# Patient Record
Sex: Male | Born: 1980 | Race: Black or African American | Hispanic: No | Marital: Single | Smoking: Former smoker
Health system: Southern US, Community
[De-identification: ages and names within clinical notes are randomized; demographics above are authoritative.]

## PROBLEM LIST (undated history)

## (undated) DIAGNOSIS — G4733 Obstructive sleep apnea (adult) (pediatric): Secondary | ICD-10-CM

## (undated) DIAGNOSIS — I219 Acute myocardial infarction, unspecified: Secondary | ICD-10-CM

## (undated) DIAGNOSIS — E7800 Pure hypercholesterolemia, unspecified: Secondary | ICD-10-CM

## (undated) DIAGNOSIS — B019 Varicella without complication: Secondary | ICD-10-CM

## (undated) DIAGNOSIS — I10 Essential (primary) hypertension: Secondary | ICD-10-CM

## (undated) DIAGNOSIS — E119 Type 2 diabetes mellitus without complications: Secondary | ICD-10-CM

## (undated) HISTORY — DX: Essential (primary) hypertension: I10

## (undated) HISTORY — DX: Obstructive sleep apnea (adult) (pediatric): G47.33

## (undated) HISTORY — DX: Varicella without complication: B01.9

---

## 2013-01-03 ENCOUNTER — Ambulatory Visit: Payer: Self-pay | Admitting: Internal Medicine

## 2013-03-13 ENCOUNTER — Ambulatory Visit (INDEPENDENT_AMBULATORY_CARE_PROVIDER_SITE_OTHER): Payer: Managed Care, Other (non HMO) | Admitting: Family Medicine

## 2013-03-13 ENCOUNTER — Encounter: Payer: Self-pay | Admitting: Family Medicine

## 2013-03-13 VITALS — BP 110/78 | HR 73 | Temp 98.1°F | Ht 70.75 in | Wt 254.2 lb

## 2013-03-13 DIAGNOSIS — F17200 Nicotine dependence, unspecified, uncomplicated: Secondary | ICD-10-CM | POA: Insufficient documentation

## 2013-03-13 DIAGNOSIS — I10 Essential (primary) hypertension: Secondary | ICD-10-CM | POA: Insufficient documentation

## 2013-03-13 LAB — LIPID PANEL
HDL: 34.8 mg/dL — ABNORMAL LOW (ref >39.00)
Total CHOL/HDL Ratio: 6
Triglycerides: 444 mg/dL — ABNORMAL HIGH (ref 0.0–149.0)
VLDL: 88.8 mg/dL — ABNORMAL HIGH (ref 0.0–40.0)

## 2013-03-13 LAB — BASIC METABOLIC PANEL
CO2: 25 mEq/L (ref 19–32)
Chloride: 105 mEq/L (ref 96–112)
Potassium: 3.9 mEq/L (ref 3.5–5.1)
Sodium: 136 mEq/L (ref 135–145)

## 2013-03-13 LAB — TSH: TSH: 1.08 u[IU]/mL (ref 0.35–5.50)

## 2013-03-13 LAB — HEPATIC FUNCTION PANEL
Albumin: 4.5 g/dL (ref 3.5–5.2)
Total Bilirubin: 0.7 mg/dL (ref 0.3–1.2)

## 2013-03-13 MED ORDER — LISINOPRIL 40 MG PO TABS: 40.0000 mg | ORAL_TABLET | Freq: Every day | ORAL | Status: DC

## 2013-03-13 NOTE — Patient Instructions (Addendum)
Schedule your complete physical for Feb We'll notify you of your lab results and make any changes if needed Try and quit smoking by switching to the E cig and stepping down the amount of nicotine Call with any questions or concerns Welcome!  We're glad to have you!

## 2013-03-13 NOTE — Progress Notes (Signed)
  Subjective:    Patient ID: Danny Reid, male    DOB: [birth year], 32 y.o.   MRN: [id number]  HPI New to establish.  Previous MD- [facility], no longer takes pt's insurance.  Last CPE Feb  HTN- chronic problem, on Lisinopril 40mg .  dx'd while in HS.  Strong family hx of HTN- mom, dad, extended family.  No CP, SOB, HAs, visual changes, edema.  Tobacco Use- started smoking age 54.  Smoking ~1/2 ppd.  Thinking about quitting.  Is considering E cigs.   Review of Systems For ROS see HPI     Objective:   Physical Exam  Vitals reviewed. Constitutional: He is oriented to person, place, and time. He appears well-developed and well-nourished. No distress.  HENT:  Head: Normocephalic and atraumatic.  Eyes: Conjunctivae and EOM are normal. Pupils are equal, round, and reactive to light.  Neck: Normal range of motion. Neck supple. No thyromegaly present.  Cardiovascular: Normal rate, regular rhythm, normal heart sounds and intact distal pulses.   No murmur heard. Pulmonary/Chest: Effort normal and breath sounds normal. No respiratory distress.  Abdominal: Soft. Bowel sounds are normal. He exhibits no distension.  Musculoskeletal: He exhibits no edema.  Lymphadenopathy:    He has no cervical adenopathy.  Neurological: He is alert and oriented to person, place, and time. No cranial nerve deficit.  Skin: Skin is warm and dry.  Psychiatric: He has a normal mood and affect. His behavior is normal.          Assessment & Plan:

## 2013-03-13 NOTE — Assessment & Plan Note (Signed)
New to provider, ongoing for pt.  Pt reports he's ready to quit due to cost of smoking.  Reviewed various options- patch, gum, lozenges, wellbutrin, chantix.  Pt prefers to attempt E cig.  Will follow.

## 2013-03-13 NOTE — Assessment & Plan Note (Signed)
New to provider, chronic for pt.  Excellent control.  Asymptomatic.  Check labs.  Encouraged smoking cessation.  Will follow.

## 2015-10-26 DIAGNOSIS — E6609 Other obesity due to excess calories: Secondary | ICD-10-CM | POA: Insufficient documentation

## 2015-10-26 DIAGNOSIS — R7309 Other abnormal glucose: Secondary | ICD-10-CM | POA: Insufficient documentation

## 2015-10-26 DIAGNOSIS — Z719 Counseling, unspecified: Secondary | ICD-10-CM | POA: Insufficient documentation

## 2017-04-23 DIAGNOSIS — R635 Abnormal weight gain: Secondary | ICD-10-CM | POA: Insufficient documentation

## 2017-04-23 DIAGNOSIS — R0683 Snoring: Secondary | ICD-10-CM | POA: Insufficient documentation

## 2017-11-02 DIAGNOSIS — E119 Type 2 diabetes mellitus without complications: Secondary | ICD-10-CM | POA: Insufficient documentation

## 2018-03-05 DIAGNOSIS — I219 Acute myocardial infarction, unspecified: Secondary | ICD-10-CM

## 2018-03-05 HISTORY — DX: Acute myocardial infarction, unspecified: I21.9

## 2018-03-06 ENCOUNTER — Encounter (HOSPITAL_BASED_OUTPATIENT_CLINIC_OR_DEPARTMENT_OTHER): Payer: Self-pay | Admitting: Emergency Medicine

## 2018-03-06 ENCOUNTER — Emergency Department (HOSPITAL_BASED_OUTPATIENT_CLINIC_OR_DEPARTMENT_OTHER): Payer: 59

## 2018-03-06 ENCOUNTER — Other Ambulatory Visit: Payer: Self-pay

## 2018-03-06 ENCOUNTER — Inpatient Hospital Stay (HOSPITAL_COMMUNITY)
Admission: EM | Disposition: A | Discharge status: Home / Self Care | Payer: Self-pay | Source: Home / Self Care | Attending: Cardiovascular Disease

## 2018-03-06 ENCOUNTER — Inpatient Hospital Stay (HOSPITAL_BASED_OUTPATIENT_CLINIC_OR_DEPARTMENT_OTHER)
Admission: EM | Admit: 2018-03-06 | Discharge: 2018-03-08 | DRG: 246 | Disposition: A | Discharge status: Home / Self Care | Payer: 59 | Attending: Cardiovascular Disease | Admitting: Cardiovascular Disease

## 2018-03-06 DIAGNOSIS — I110 Hypertensive heart disease with heart failure: Secondary | ICD-10-CM | POA: Diagnosis present

## 2018-03-06 DIAGNOSIS — I2119 ST elevation (STEMI) myocardial infarction involving other coronary artery of inferior wall: Secondary | ICD-10-CM | POA: Diagnosis present

## 2018-03-06 DIAGNOSIS — I2111 ST elevation (STEMI) myocardial infarction involving right coronary artery: Secondary | ICD-10-CM

## 2018-03-06 DIAGNOSIS — E7800 Pure hypercholesterolemia, unspecified: Secondary | ICD-10-CM | POA: Diagnosis present

## 2018-03-06 DIAGNOSIS — Z7984 Long term (current) use of oral hypoglycemic drugs: Secondary | ICD-10-CM | POA: Diagnosis not present

## 2018-03-06 DIAGNOSIS — I10 Essential (primary) hypertension: Secondary | ICD-10-CM | POA: Diagnosis present

## 2018-03-06 DIAGNOSIS — Z8249 Family history of ischemic heart disease and other diseases of the circulatory system: Secondary | ICD-10-CM | POA: Diagnosis not present

## 2018-03-06 DIAGNOSIS — I214 Non-ST elevation (NSTEMI) myocardial infarction: Secondary | ICD-10-CM

## 2018-03-06 DIAGNOSIS — R748 Abnormal levels of other serum enzymes: Secondary | ICD-10-CM | POA: Diagnosis present

## 2018-03-06 DIAGNOSIS — I252 Old myocardial infarction: Secondary | ICD-10-CM | POA: Insufficient documentation

## 2018-03-06 DIAGNOSIS — Z6836 Body mass index (BMI) 36.0-36.9, adult: Secondary | ICD-10-CM

## 2018-03-06 DIAGNOSIS — F17210 Nicotine dependence, cigarettes, uncomplicated: Secondary | ICD-10-CM | POA: Diagnosis present

## 2018-03-06 DIAGNOSIS — I340 Nonrheumatic mitral (valve) insufficiency: Secondary | ICD-10-CM | POA: Diagnosis not present

## 2018-03-06 DIAGNOSIS — E669 Obesity, unspecified: Secondary | ICD-10-CM | POA: Diagnosis present

## 2018-03-06 DIAGNOSIS — Z820 Family history of epilepsy and other diseases of the nervous system: Secondary | ICD-10-CM

## 2018-03-06 DIAGNOSIS — F17200 Nicotine dependence, unspecified, uncomplicated: Secondary | ICD-10-CM | POA: Diagnosis present

## 2018-03-06 DIAGNOSIS — R7989 Other specified abnormal findings of blood chemistry: Secondary | ICD-10-CM

## 2018-03-06 DIAGNOSIS — I2510 Atherosclerotic heart disease of native coronary artery without angina pectoris: Secondary | ICD-10-CM | POA: Diagnosis present

## 2018-03-06 DIAGNOSIS — I5041 Acute combined systolic (congestive) and diastolic (congestive) heart failure: Secondary | ICD-10-CM | POA: Diagnosis present

## 2018-03-06 DIAGNOSIS — Z955 Presence of coronary angioplasty implant and graft: Secondary | ICD-10-CM

## 2018-03-06 DIAGNOSIS — E785 Hyperlipidemia, unspecified: Secondary | ICD-10-CM

## 2018-03-06 DIAGNOSIS — E119 Type 2 diabetes mellitus without complications: Secondary | ICD-10-CM | POA: Diagnosis present

## 2018-03-06 DIAGNOSIS — R778 Other specified abnormalities of plasma proteins: Secondary | ICD-10-CM

## 2018-03-06 HISTORY — DX: Pure hypercholesterolemia, unspecified: E78.00

## 2018-03-06 HISTORY — PX: LEFT HEART CATH AND CORONARY ANGIOGRAPHY: CATH118249

## 2018-03-06 HISTORY — DX: Type 2 diabetes mellitus without complications: E11.9

## 2018-03-06 HISTORY — PX: CORONARY/GRAFT ACUTE MI REVASCULARIZATION: CATH118305

## 2018-03-06 HISTORY — PX: CORONARY STENT INTERVENTION: CATH118234

## 2018-03-06 LAB — CBC WITH DIFFERENTIAL/PLATELET
BASOS PCT: 0 %
Basophils Absolute: 0.1 10*3/uL (ref 0.0–0.1)
Eosinophils Absolute: 0 10*3/uL (ref 0.0–0.7)
Eosinophils Relative: 0 %
HEMATOCRIT: 45.3 % (ref 39.0–52.0)
HEMOGLOBIN: 15.6 g/dL (ref 13.0–17.0)
LYMPHS PCT: 9 %
Lymphs Abs: 1.5 10*3/uL (ref 0.7–4.0)
MCH: 28.5 pg (ref 26.0–34.0)
MCHC: 34.4 g/dL (ref 30.0–36.0)
MCV: 82.7 fL (ref 78.0–100.0)
MONO ABS: 0.4 10*3/uL (ref 0.1–1.0)
Monocytes Relative: 3 %
NEUTROS ABS: 14.3 10*3/uL — AB (ref 1.7–7.7)
Neutrophils Relative %: 88 %
Platelets: 401 10*3/uL — ABNORMAL HIGH (ref 150–400)
RBC: 5.48 MIL/uL (ref 4.22–5.81)
RDW: 12.4 % (ref 11.5–15.5)
WBC: 16.3 10*3/uL — AB (ref 4.0–10.5)

## 2018-03-06 LAB — CBC
HCT: 43 % (ref 39.0–52.0)
HCT: 40.1 % (ref 39.0–52.0)
HEMATOCRIT: 41.5 % (ref 39.0–52.0)
HEMOGLOBIN: 12.9 g/dL — AB (ref 13.0–17.0)
Hemoglobin: 13.9 g/dL (ref 13.0–17.0)
Hemoglobin: 13.8 g/dL (ref 13.0–17.0)
MCH: 28.1 pg (ref 26.0–34.0)
MCH: 28.6 pg (ref 26.0–34.0)
MCH: 27.7 pg (ref 26.0–34.0)
MCHC: 32.3 g/dL (ref 30.0–36.0)
MCHC: 33.3 g/dL (ref 30.0–36.0)
MCHC: 32.2 g/dL (ref 30.0–36.0)
MCV: 86.9 fL (ref 78.0–100.0)
MCV: 85.9 fL (ref 78.0–100.0)
MCV: 86.2 fL (ref 78.0–100.0)
PLATELETS: 358 10*3/uL (ref 150–400)
Platelets: ADEQUATE 10*3/uL (ref 150–400)
Platelets: 371 10*3/uL (ref 150–400)
RBC: 4.95 MIL/uL (ref 4.22–5.81)
RBC: 4.83 MIL/uL (ref 4.22–5.81)
RBC: 4.65 MIL/uL (ref 4.22–5.81)
RDW: 12.2 % (ref 11.5–15.5)
RDW: 12.5 % (ref 11.5–15.5)
RDW: 12.5 % (ref 11.5–15.5)
WBC: 15.2 10*3/uL — AB (ref 4.0–10.5)
WBC: 17.8 10*3/uL — AB (ref 4.0–10.5)
WBC: 16.2 10*3/uL — AB (ref 4.0–10.5)

## 2018-03-06 LAB — BRAIN NATRIURETIC PEPTIDE: B Natriuretic Peptide: 81.6 pg/mL (ref 0.0–100.0)

## 2018-03-06 LAB — COMPREHENSIVE METABOLIC PANEL
ALBUMIN: 4.9 g/dL (ref 3.5–5.0)
ALK PHOS: 55 U/L (ref 38–126)
ALT: 48 U/L — ABNORMAL HIGH (ref 0–44)
AST: 42 U/L — AB (ref 15–41)
Anion gap: 10 (ref 5–15)
BILIRUBIN TOTAL: 0.8 mg/dL (ref 0.3–1.2)
BUN: 11 mg/dL (ref 6–20)
CALCIUM: 9.9 mg/dL (ref 8.9–10.3)
CO2: 29 mmol/L (ref 22–32)
Chloride: 100 mmol/L (ref 98–111)
Creatinine, Ser: 1.08 mg/dL (ref 0.61–1.24)
GFR calc Af Amer: >60 mL/min (ref >60)
GLUCOSE: 171 mg/dL — AB (ref 70–99)
Potassium: 4.3 mmol/L (ref 3.5–5.1)
Sodium: 139 mmol/L (ref 135–145)
Total Protein: 8.5 g/dL — ABNORMAL HIGH (ref 6.5–8.1)

## 2018-03-06 LAB — CREATININE, SERUM
CREATININE: 0.92 mg/dL (ref 0.61–1.24)
GFR calc Af Amer: >60 mL/min (ref >60)

## 2018-03-06 LAB — TROPONIN I
TROPONIN I: 0.28 ng/mL — AB (ref <0.03)
Troponin I: 1.81 ng/mL (ref <0.03)
Troponin I: 58.12 ng/mL (ref <0.03)
Troponin I: 49.35 ng/mL (ref <0.03)

## 2018-03-06 LAB — GLUCOSE, CAPILLARY: Glucose-Capillary: 131 mg/dL — ABNORMAL HIGH (ref 70–99)

## 2018-03-06 LAB — CK: CK TOTAL: 383 U/L (ref 49–397)

## 2018-03-06 LAB — LIPASE, BLOOD: Lipase: 27 U/L (ref 11–51)

## 2018-03-06 LAB — HEMOGLOBIN A1C
HEMOGLOBIN A1C: 6 % — AB (ref 4.8–5.6)
Mean Plasma Glucose: 125.5 mg/dL

## 2018-03-06 LAB — MRSA PCR SCREENING: MRSA BY PCR: NEGATIVE

## 2018-03-06 SURGERY — LEFT HEART CATH AND CORONARY ANGIOGRAPHY
Anesthesia: LOCAL

## 2018-03-06 MED ORDER — NITROGLYCERIN 1 MG/10 ML FOR IR/CATH LAB
INTRA_ARTERIAL | Status: DC | PRN
  Administered 2018-03-06: 200 ug via INTRACORONARY

## 2018-03-06 MED ORDER — TIROFIBAN HCL IV 12.5 MG/250 ML
INTRAVENOUS | Status: AC
  Filled 2018-03-06: qty 250

## 2018-03-06 MED ORDER — LIDOCAINE HCL (PF) 1 % IJ SOLN
INTRAMUSCULAR | Status: AC
  Filled 2018-03-06: qty 30

## 2018-03-06 MED ORDER — FENTANYL CITRATE (PF) 100 MCG/2ML IJ SOLN: 50.0000 ug | Freq: Once | INTRAMUSCULAR | Status: DC

## 2018-03-06 MED ORDER — NITROGLYCERIN IN D5W 200-5 MCG/ML-% IV SOLN
0.0000 ug/min | Freq: Once | INTRAVENOUS | Status: AC
  Administered 2018-03-06: 5 ug/min via INTRAVENOUS
  Filled 2018-03-06: qty 250

## 2018-03-06 MED ORDER — ASPIRIN 325 MG PO TABS: 325.0000 mg | ORAL_TABLET | Freq: Once | ORAL | Status: DC

## 2018-03-06 MED ORDER — MORPHINE SULFATE (PF) 2 MG/ML IV SOLN
2.0000 mg | INTRAVENOUS | Status: DC | PRN
  Administered 2018-03-06: 4 mg via INTRAVENOUS
  Filled 2018-03-06: qty 2

## 2018-03-06 MED ORDER — SODIUM CHLORIDE 0.9 % WEIGHT BASED INFUSION: 1.0000 mL/kg/h | INTRAVENOUS | Status: DC

## 2018-03-06 MED ORDER — ATORVASTATIN CALCIUM 80 MG PO TABS
80.0000 mg | ORAL_TABLET | Freq: Every day | ORAL | Status: DC
  Administered 2018-03-06 – 2018-03-07 (×2): 80 mg via ORAL
  Filled 2018-03-06 (×2): qty 1

## 2018-03-06 MED ORDER — LABETALOL HCL 5 MG/ML IV SOLN: 10.0000 mg | INTRAVENOUS | Status: AC | PRN

## 2018-03-06 MED ORDER — SODIUM CHLORIDE 0.9 % WEIGHT BASED INFUSION
3.0000 mL/kg/h | INTRAVENOUS | Status: DC
  Administered 2018-03-06: 3 mL/kg/h via INTRAVENOUS

## 2018-03-06 MED ORDER — SODIUM CHLORIDE 0.9 % IV SOLN: 250.0000 mL | INTRAVENOUS | Status: DC | PRN

## 2018-03-06 MED ORDER — ONDANSETRON HCL 4 MG/2ML IJ SOLN: 4.0000 mg | Freq: Four times a day (QID) | INTRAMUSCULAR | Status: DC | PRN

## 2018-03-06 MED ORDER — SODIUM CHLORIDE 0.9% FLUSH
3.0000 mL | Freq: Two times a day (BID) | INTRAVENOUS | Status: DC
  Administered 2018-03-06 – 2018-03-08 (×5): 3 mL via INTRAVENOUS

## 2018-03-06 MED ORDER — NITROGLYCERIN 0.4 MG SL SUBL
0.4000 mg | SUBLINGUAL_TABLET | SUBLINGUAL | Status: AC | PRN
  Administered 2018-03-06 (×3): 0.4 mg via SUBLINGUAL
  Filled 2018-03-06: qty 1

## 2018-03-06 MED ORDER — SODIUM CHLORIDE 0.9 % IV BOLUS
1000.0000 mL | Freq: Once | INTRAVENOUS | Status: AC
  Administered 2018-03-06: 1000 mL via INTRAVENOUS

## 2018-03-06 MED ORDER — INSULIN ASPART 100 UNIT/ML ~~LOC~~ SOLN
0.0000 [IU] | Freq: Three times a day (TID) | SUBCUTANEOUS | Status: DC
  Administered 2018-03-06 – 2018-03-07 (×2): 2 [IU] via SUBCUTANEOUS

## 2018-03-06 MED ORDER — TIROFIBAN (AGGRASTAT) BOLUS VIA INFUSION
INTRAVENOUS | Status: DC | PRN
  Administered 2018-03-06: 2925 ug via INTRAVENOUS

## 2018-03-06 MED ORDER — VERAPAMIL HCL 2.5 MG/ML IV SOLN
INTRAVENOUS | Status: AC
  Filled 2018-03-06: qty 2

## 2018-03-06 MED ORDER — HEPARIN SODIUM (PORCINE) 1000 UNIT/ML IJ SOLN
INTRAMUSCULAR | Status: DC | PRN
  Administered 2018-03-06: 10000 [IU] via INTRAVENOUS

## 2018-03-06 MED ORDER — HEPARIN SODIUM (PORCINE) 5000 UNIT/ML IJ SOLN: 5000.0000 [IU] | Freq: Three times a day (TID) | INTRAMUSCULAR | Status: DC

## 2018-03-06 MED ORDER — FENTANYL CITRATE (PF) 100 MCG/2ML IJ SOLN
INTRAMUSCULAR | Status: AC
  Filled 2018-03-06: qty 2

## 2018-03-06 MED ORDER — METOPROLOL TARTRATE 12.5 MG HALF TABLET
12.5000 mg | ORAL_TABLET | Freq: Two times a day (BID) | ORAL | Status: DC
  Administered 2018-03-06 – 2018-03-08 (×5): 12.5 mg via ORAL
  Filled 2018-03-06 (×5): qty 1

## 2018-03-06 MED ORDER — ASPIRIN EC 81 MG PO TBEC
81.0000 mg | DELAYED_RELEASE_TABLET | Freq: Every day | ORAL | Status: DC
  Administered 2018-03-07 – 2018-03-08 (×2): 81 mg via ORAL
  Filled 2018-03-06 (×2): qty 1

## 2018-03-06 MED ORDER — ASPIRIN 81 MG PO CHEW
324.0000 mg | CHEWABLE_TABLET | Freq: Once | ORAL | Status: AC
  Administered 2018-03-06: 324 mg via ORAL
  Filled 2018-03-06: qty 4

## 2018-03-06 MED ORDER — ACETAMINOPHEN 325 MG PO TABS: 650.0000 mg | ORAL_TABLET | ORAL | Status: DC | PRN

## 2018-03-06 MED ORDER — SODIUM CHLORIDE 0.9 % IV SOLN
INTRAVENOUS | Status: AC
  Administered 2018-03-06 (×2): via INTRAVENOUS

## 2018-03-06 MED ORDER — TIROFIBAN HCL IV 12.5 MG/250 ML
0.1500 ug/kg/min | INTRAVENOUS | Status: AC
  Administered 2018-03-06: 0.15 ug/kg/min via INTRAVENOUS
  Filled 2018-03-06: qty 250

## 2018-03-06 MED ORDER — TICAGRELOR 90 MG PO TABS
ORAL_TABLET | ORAL | Status: AC
  Filled 2018-03-06: qty 2

## 2018-03-06 MED ORDER — HEPARIN (PORCINE) IN NACL 2-0.9 UNITS/ML
INTRAMUSCULAR | Status: AC | PRN
  Administered 2018-03-06 (×2): 500 mL

## 2018-03-06 MED ORDER — MORPHINE SULFATE (PF) 2 MG/ML IV SOLN
2.0000 mg | INTRAVENOUS | Status: DC | PRN
  Administered 2018-03-06: 2 mg via INTRAVENOUS
  Filled 2018-03-06: qty 1

## 2018-03-06 MED ORDER — SODIUM CHLORIDE 0.9% FLUSH: 3.0000 mL | INTRAVENOUS | Status: DC | PRN

## 2018-03-06 MED ORDER — MORPHINE SULFATE (PF) 4 MG/ML IV SOLN: 4.0000 mg | Freq: Once | INTRAVENOUS | Status: DC

## 2018-03-06 MED ORDER — ASPIRIN 81 MG PO CHEW: 81.0000 mg | CHEWABLE_TABLET | ORAL | Status: DC

## 2018-03-06 MED ORDER — HYDRALAZINE HCL 20 MG/ML IJ SOLN: 5.0000 mg | INTRAMUSCULAR | Status: AC | PRN

## 2018-03-06 MED ORDER — GI COCKTAIL ~~LOC~~: 30.0000 mL | Freq: Once | ORAL | Status: DC

## 2018-03-06 MED ORDER — HEPARIN BOLUS VIA INFUSION
4000.0000 [IU] | Freq: Once | INTRAVENOUS | Status: AC
  Administered 2018-03-06: 4000 [IU] via INTRAVENOUS

## 2018-03-06 MED ORDER — IOHEXOL 350 MG/ML SOLN
INTRAVENOUS | Status: DC | PRN
  Administered 2018-03-06: 200 mL

## 2018-03-06 MED ORDER — TIROFIBAN HCL IN NACL 5-0.9 MG/100ML-% IV SOLN
0.1500 ug/kg/min | INTRAVENOUS | Status: DC
  Administered 2018-03-06: 0.15 ug/kg/min via INTRAVENOUS
  Filled 2018-03-06 (×4): qty 100

## 2018-03-06 MED ORDER — TIROFIBAN HCL IV 12.5 MG/250 ML
INTRAVENOUS | Status: AC | PRN
  Administered 2018-03-06: 0.15 ug/kg/min via INTRAVENOUS

## 2018-03-06 MED ORDER — TICAGRELOR 90 MG PO TABS
ORAL_TABLET | ORAL | Status: DC | PRN
  Administered 2018-03-06: 180 mg via ORAL

## 2018-03-06 MED ORDER — MIDAZOLAM HCL 2 MG/2ML IJ SOLN
INTRAMUSCULAR | Status: DC | PRN
  Administered 2018-03-06: 2 mg via INTRAVENOUS

## 2018-03-06 MED ORDER — HEPARIN SODIUM (PORCINE) 1000 UNIT/ML IJ SOLN
INTRAMUSCULAR | Status: AC
  Filled 2018-03-06: qty 1

## 2018-03-06 MED ORDER — NITROGLYCERIN 1 MG/10 ML FOR IR/CATH LAB
INTRA_ARTERIAL | Status: AC
  Filled 2018-03-06: qty 10

## 2018-03-06 MED ORDER — NITROGLYCERIN 0.4 MG SL SUBL: 0.4000 mg | SUBLINGUAL_TABLET | SUBLINGUAL | Status: DC | PRN

## 2018-03-06 MED ORDER — HEPARIN (PORCINE) IN NACL 100-0.45 UNIT/ML-% IJ SOLN
1350.0000 [IU]/h | INTRAMUSCULAR | Status: DC
  Administered 2018-03-06: 1350 [IU]/h via INTRAVENOUS
  Filled 2018-03-06: qty 250

## 2018-03-06 MED ORDER — LIDOCAINE HCL (PF) 1 % IJ SOLN
INTRAMUSCULAR | Status: DC | PRN
  Administered 2018-03-06: 2 mL

## 2018-03-06 MED ORDER — HYDROMORPHONE HCL 1 MG/ML IJ SOLN: 0.5000 mg | INTRAMUSCULAR | Status: DC | PRN

## 2018-03-06 MED ORDER — TICAGRELOR 90 MG PO TABS
90.0000 mg | ORAL_TABLET | Freq: Two times a day (BID) | ORAL | Status: DC
  Administered 2018-03-06 – 2018-03-08 (×4): 90 mg via ORAL
  Filled 2018-03-06 (×4): qty 1

## 2018-03-06 MED ORDER — VERAPAMIL HCL 2.5 MG/ML IV SOLN
INTRAVENOUS | Status: DC | PRN
  Administered 2018-03-06: 10 mL via INTRA_ARTERIAL

## 2018-03-06 MED ORDER — FENTANYL CITRATE (PF) 100 MCG/2ML IJ SOLN
INTRAMUSCULAR | Status: DC | PRN
  Administered 2018-03-06: 25 ug via INTRAVENOUS

## 2018-03-06 MED ORDER — HEPARIN (PORCINE) IN NACL 1000-0.9 UT/500ML-% IV SOLN
INTRAVENOUS | Status: AC
Start: 2018-03-06 — End: ?
  Filled 2018-03-06: qty 1000

## 2018-03-06 MED ORDER — MIDAZOLAM HCL 2 MG/2ML IJ SOLN
INTRAMUSCULAR | Status: AC
  Filled 2018-03-06: qty 2

## 2018-03-06 SURGICAL SUPPLY — 20 items
BALLN SAPPHIRE 2.5X15 (BALLOONS) ×4
BALLN ~~LOC~~ EMERGE MR 3.25X15 (BALLOONS) ×2
BALLOON SAPPHIRE 2.5X15 (BALLOONS) ×2 IMPLANT
BALLOON ~~LOC~~ EMERGE MR 3.25X15 (BALLOONS) ×1 IMPLANT
CATH 5FR JL3.5 JR4 ANG PIG MP (CATHETERS) ×2 IMPLANT
CATH VISTA GUIDE 6FR JR4 (CATHETERS) ×2 IMPLANT
DEVICE RAD COMP TR BAND LRG (VASCULAR PRODUCTS) ×2 IMPLANT
ELECT DEFIB PAD ADLT CADENCE (PAD) ×2 IMPLANT
GLIDESHEATH SLEND SS 6F .021 (SHEATH) ×2 IMPLANT
GUIDEWIRE INQWIRE 1.5J.035X260 (WIRE) ×2 IMPLANT
INQWIRE 1.5J .035X260CM (WIRE) ×4
KIT ENCORE 26 ADVANTAGE (KITS) ×2 IMPLANT
KIT HEART LEFT (KITS) ×2 IMPLANT
PACK CARDIAC CATHETERIZATION (CUSTOM PROCEDURE TRAY) ×2 IMPLANT
STENT RESOLUTE ONYX 3.0X18 (Permanent Stent) ×2 IMPLANT
STENT RESOLUTE ONYX 4.0X26 (Permanent Stent) ×2 IMPLANT
SYR MEDRAD MARK V 150ML (SYRINGE) ×2 IMPLANT
TRANSDUCER W/STOPCOCK (MISCELLANEOUS) ×2 IMPLANT
TUBING CIL FLEX 10 FLL-RA (TUBING) ×2 IMPLANT
WIRE COUGAR XT STRL 190CM (WIRE) ×4 IMPLANT

## 2018-03-06 NOTE — ED Notes (Signed)
ED Provider at bedside. 

## 2018-03-06 NOTE — Progress Notes (Signed)
ANTICOAGULATION CONSULT NOTE  Pharmacy Consult for heparin, aggrastat Indication: chest pain/ACS  No Known Allergies  Patient Measurements: Height: 5\' 10"  (177.8 cm) Weight: 257 lb 15 oz (117 kg) IBW/kg (Calculated) : 73 Heparin Dosing Weight: 100kg  Vital Signs: Temp: 99 F (37.2 C) (07/07 0649) Temp Source: Oral (07/07 0649) BP: 107/66 (07/07 0934) Pulse Rate: 64 (07/07 0934)  Labs: Recent Labs    03/06/18 0246 03/06/18 0249 03/06/18 0722  HGB 15.6  --   --   HCT 45.3  --   --   PLT 401*  --   --   CREATININE 1.08  --   --   CKTOTAL  --  383  --   TROPONINI 0.28*  --  1.81*    Estimated Creatinine Clearance: 117.7 mL/min (by C-G formula based on SCr of 1.08 mg/dL).   Assessment: 37 YOM with chest pressure since yesterday with diaphoresis and nausea. Troponin mildly elevated. No anticoagulation PTA.  Patient taken to cath for urgent nstemi this am found to have occluded RCA treated with DES. Heavy thrombus noted during cath and aggrastat started in cath and to be continued 12 hours post cath.   Goal of Therapy:  Heparin level 0.3-0.7 units/ml Monitor platelets by anticoagulation protocol: Yes   Plan:  Aggrastat x 12 hours post cath Heparin not to be restarted Check cbc in am  Sheppard Coil PharmD., BCPS Clinical Pharmacist 03/06/2018 10:18 AM

## 2018-03-06 NOTE — ED Triage Notes (Signed)
Pt reports central chest pain since yesterday, worsening around 2000 tonight. Also reports nausea, and is diaphoretic.

## 2018-03-06 NOTE — ED Notes (Signed)
Troponin level of 0.28. MD aware

## 2018-03-06 NOTE — Progress Notes (Signed)
ANTICOAGULATION CONSULT NOTE - Initial Consult  Pharmacy Consult for heparin Indication: chest pain/ACS  No Known Allergies  Patient Measurements: Height: 5\' 11"  (180.3 cm) Weight: 250 lb (113.4 kg) IBW/kg (Calculated) : 75.3 Heparin Dosing Weight: 100kg  Vital Signs: Temp: 97.7 F (36.5 C) (07/07 0241) Temp Source: Oral (07/07 0241) BP: 135/80 (07/07 0337) Pulse Rate: 60 (07/07 0337)  Labs: Recent Labs    03/06/18 0246 03/06/18 0249  HGB 15.6  --   HCT 45.3  --   PLT 401*  --   CREATININE 1.08  --   CKTOTAL  --  383  TROPONINI 0.28*  --     Estimated Creatinine Clearance: 117.5 mL/min (by C-G formula based on SCr of 1.08 mg/dL).   Assessment: 37 YOM with chest pressure since yesterday with diaphoresis and nausea. Troponin mildly elevated. No anticoagulation PTA.  Goal of Therapy:  Heparin level 0.3-0.7 units/ml Monitor platelets by anticoagulation protocol: Yes   Plan:  Heparin bolus 4000 units IV x1, then start infusion at 1350 units/hr Heparin level in 6 hours Daily heparin level and CBC Follow cardiology plans  Deaira Leckey D. Eliga Arvie, PharmD, BCPS Clinical Pharmacist [phone] Please check AMION for all [facility] Pharmacy numbers 03/06/2018 3:41 AM

## 2018-03-06 NOTE — ED Notes (Signed)
Called for report x 1, RN busy, left callback number.

## 2018-03-06 NOTE — Progress Notes (Signed)
Lab was unsuccessful with pt's blood draw. Dr. Charlestine Night with Cardiology paged and said ok for lab to draw blood from pt's right extremity. MD aware TRB has been off since 1430. Lab may also use pt's feet for lab draw blood if needed. Will notify lab

## 2018-03-06 NOTE — Progress Notes (Signed)
Pt c/o chest pain 4/10 mid sternal. EKG done, and Dr. Excell Seltzer paged. Order for Morphine placed and given. Will continue to monitor pt closely.

## 2018-03-06 NOTE — Progress Notes (Signed)
Pt arrived from [facility] via [facility]. Pt complaining of 10/10 chest pain pt diaphoretic. MD paged. New orders given. Prn given. EKG in chart. Vital signs stable. CHG completed. Plan of care reviewed. Will pass on to day shift RN.

## 2018-03-06 NOTE — Progress Notes (Signed)
   Just back from Cath Lab.  Hemodynamically stable.  Postprocedure EKG accelerated idioventricular rhythm due to reperfusion.  Repeat EKG later this morning.

## 2018-03-06 NOTE — Interval H&P Note (Signed)
Cath Lab Visit (complete for each Cath Lab visit)  Clinical Evaluation Leading to the Procedure:   ACS: Yes.    Non-ACS:    Anginal Classification: CCS IV  Anti-ischemic medical therapy: No Therapy  Non-Invasive Test Results: No non-invasive testing performed  Prior CABG: No previous CABG      History and Physical Interval Note:  03/06/2018 8:22 AM  Danny Reid  has presented today for surgery, with the diagnosis of NSTEMI  The various methods of treatment have been discussed with the patient and family. After consideration of risks, benefits and other options for treatment, the patient has consented to  Procedure(s): LEFT HEART CATH AND CORONARY ANGIOGRAPHY (N/A) as a surgical intervention .  The patient's history has been reviewed, patient examined, no change in status, stable for surgery.  I have reviewed the patient's chart and labs.  Questions were answered to the patient's satisfaction.     Tonny Bollman

## 2018-03-06 NOTE — ED Notes (Signed)
Pt to XR

## 2018-03-06 NOTE — H&P (Signed)
Cardiology Admission History and Physical:   Patient ID: Danny Reid; MRN: [id number]; DOB: 08/17/1981   Admission date: 03/06/2018  Primary Care Provider: Wanda Plump, MD Primary Cardiologist: No primary care provider on file.   Chief Complaint:  Chest Pain  Patient Profile:   Danny Reid is a 37 y.o. male with a history of diabetes, hypertension, and hyperlipidemia presenting with severe substernal chest pain  History of Present Illness:   Danny Reid initially presented to med [facility] with chest pain.  ER evaluation demonstrates a troponin of 0.28, total CK of 383, and EKG demonstrating inferior infarct pattern with sub-millimeter ST elevation not diagnostic of STEMI.  The patient is transferred to [facility] for further evaluation.  He is treated with IV heparin and nitroglycerin with improvement in his chest pain symptoms.  However, on arrival here he developed recurrent 10/10 chest pain.  At the time of my evaluation, his mother is at the bedside.  The patient has ongoing substernal chest pain that feels like a "pressure."  He denies any radiation of the pain.  He denies shortness of breath, heart palpitations, orthopnea, PND, or leg swelling.  He admits to diaphoresis, nausea, and vomiting.  Reports that he vomited throughout the night.  His pain started at 8 PM last night and has been continuous since that time.  He has no past history of cardiac problems.  He has had no hospitalizations or surgeries in the past.   Past Medical History:  Diagnosis Date  . Chickenpox   . Diabetes mellitus without complication (HCC)    borderline  . High cholesterol   . Hypertension     History reviewed. No pertinent surgical history.   Medications Prior to Admission: Prior to Admission medications   Medication Sig Start Date End Date Taking? Authorizing Provider  atorvastatin (LIPITOR) 20 MG tablet Take 20 mg by mouth daily.   Yes [provider]  metFORMIN  (GLUCOPHAGE) 500 MG tablet Take by mouth 2 (two) times daily with a meal.   Yes [provider]  lisinopril (PRINIVIL,ZESTRIL) 40 MG tablet Take 1 tablet (40 mg total) by mouth daily. 03/13/13   Sheliah Hatch, MD     Allergies:   No Known Allergies  Social History:   Social History   Socioeconomic History  . Marital status: Single    Spouse name: Not on file  . Number of children: Not on file  . Years of education: Not on file  . Highest education level: Not on file  Occupational History  . Not on file  Social Needs  . Financial resource strain: Not on file  . Food insecurity:    Worry: Not on file    Inability: Not on file  . Transportation needs:    Medical: Not on file    Non-medical: Not on file  Tobacco Use  . Smoking status: Current Every Day Smoker    Packs/day: 0.50    Years: 13.00    Pack years: 6.50    Types: Cigarettes  Substance and Sexual Activity  . Alcohol use: Yes    Comment: social  . Drug use: No  . Sexual activity: Not on file  Lifestyle  . Physical activity:    Days per week: Not on file    Minutes per session: Not on file  . Stress: Not on file  Relationships  . Social connections:    Talks on phone: Not on file    Gets together: Not on  file    Attends religious service: Not on file    Active member of club or organization: Not on file    Attends meetings of clubs or organizations: Not on file    Relationship status: Not on file  . Intimate partner violence:    Fear of current or ex partner: Not on file    Emotionally abused: Not on file    Physically abused: Not on file    Forced sexual activity: Not on file  Other Topics Concern  . Not on file  Social History Narrative  . Not on file    Family History:   The patient's family history includes Heart attack in his paternal grandfather; Hypertension in his mother; Multiple sclerosis in his paternal aunt.  The patient's grandfather had an MI at about age 59  ROS:  Please  see the history of present illness.  All other ROS reviewed and negative.     Physical Exam/Data:   Vitals:   03/06/18 0525 03/06/18 0530 03/06/18 0545 03/06/18 0649  BP: 119/77 121/79 131/77 115/73  Pulse: 80 71 75 (!) 43  Resp: 15 13 16 20   Temp:    99 F (37.2 C)  TempSrc:    Oral  SpO2: 98% 98% 98% 98%  Weight:    257 lb 15 oz (117 kg)  Height:    5\' 10"  (1.778 m)   No intake or output data in the 24 hours ending 03/06/18 0738 Filed Weights   03/06/18 0243 03/06/18 0649  Weight: 250 lb (113.4 kg) 257 lb 15 oz (117 kg)   Body mass index is 37.01 kg/m.  General:  Well nourished, well developed, in moderate distress secondary to pain, no respiratory distress. HEENT: normal Lymph: no adenopathy Neck: no JVD Endocrine:  No thryomegaly Vascular: No carotid bruits; FA pulses 2+   Cardiac:  normal S1, S2; RRR; no murmur  Lungs:  clear to auscultation bilaterally, no wheezing, rhonchi or rales  Abd: soft, nontender, no hepatomegaly  Ext: no edema Musculoskeletal:  No deformities, BUE and BLE strength normal and equal Skin: warm and dry  Neuro:  CNs 2-12 intact, no focal abnormalities noted Psych:  Normal affect    EKG:  The ECG that was done this morning was personally reviewed and demonstrates normal sinus rhythm with age-indeterminate inferior MI  Laboratory Data:  Chemistry Recent Labs  Lab 03/06/18 0246  NA 139  K 4.3  CL 100  CO2 29  GLUCOSE 171*  BUN 11  CREATININE 1.08  CALCIUM 9.9  GFRNONAA >60  GFRAA >60  ANIONGAP 10    Recent Labs  Lab 03/06/18 0246  PROT 8.5*  ALBUMIN 4.9  AST 42*  ALT 48*  ALKPHOS 55  BILITOT 0.8   Hematology Recent Labs  Lab 03/06/18 0246  WBC 16.3*  RBC 5.48  HGB 15.6  HCT 45.3  MCV 82.7  MCH 28.5  MCHC 34.4  RDW 12.4  PLT 401*   Cardiac Enzymes Recent Labs  Lab 03/06/18 0246  TROPONINI 0.28*   No results for input(s): TROPIPOC in the last 168 hours.  BNPNo results for input(s): BNP, PROBNP in the last  168 hours.  DDimer No results for input(s): DDIMER in the last 168 hours.  Radiology/Studies:  Dg Chest 2 View  Result Date: 03/06/2018 CLINICAL DATA:  Central chest pain since yesterday. Nausea and diaphoresis. EXAM: CHEST - 2 VIEW COMPARISON:  None. FINDINGS: The heart size and mediastinal contours are within normal limits. Both lungs  are clear. The visualized skeletal structures are unremarkable. IMPRESSION: No active cardiopulmonary disease. Electronically Signed   By: Burman Nieves M.D.   On: 03/06/2018 03:06    Assessment and Plan:   1. Acute inferior MI: The patient has ongoing chest pain symptoms and appears quite uncomfortable at the time of my evaluation.  This is despite high-dose IV nitroglycerin and IV heparin.  Emergency cardiac catheterization and PCI are indicated.  Risks, indications, and alternatives are reviewed in detail with the patient and his family.  They understand and agree to proceed.  He will be loaded with ticagrelor once his coronary anatomy is defined.  Further management pending cardiac catheterization results. 2. Type II DM: will cover with SSI while here, lifestyle modification. Hold metformin. 3. HTN: start beta blocker, continue ACE-I 4. Dyslipidemia: high intensity statin, atorvastatin 80 mg 5. Tobacco (cigars): cessation counseling will be done 6. Dispo: pending cath results  Severity of Illness: The appropriate patient status for this patient is INPATIENT. Inpatient status is judged to be reasonable and necessary in order to provide the required intensity of service to ensure the patient's safety. The patient's presenting symptoms, physical exam findings, and initial radiographic and laboratory data in the context of their chronic comorbidities is felt to place them at high risk for further clinical deterioration. Furthermore, it is not anticipated that the patient will be medically stable for discharge from the hospital within 2 midnights of admission. The  following factors support the patient status of inpatient.   * I certify that at the point of admission it is my clinical judgment that the patient will require inpatient hospital care spanning beyond 2 midnights from the point of admission due to high intensity of service, high risk for further deterioration and high frequency of surveillance required.*   For questions or updates, please contact [facility] Please consult [email or web address] for contact info under Cardiology/STEMI.   Signed, Tonny Bollman, MD  03/06/2018 7:38 AM

## 2018-03-06 NOTE — ED Provider Notes (Signed)
[facility] EMERGENCY DEPARTMENT Provider Note   CSN: [id number] Arrival date & time: 03/06/18  0236     History   Chief Complaint Chief Complaint  Patient presents with  . Chest Pain    HPI Danny Reid is a 37 y.o. male hx of borderline DM, HL, HTN, here with chest pain, diaphoresis.  Patient states that he has some central chest pain since yesterday.  Since 8 PM, he states that the pain is constant and associated with some diaphoresis.  Also feeling nauseated as well.  Denies any recent travel or leg swelling or history of blood clots.  Patient adamantly denies any drug use.  Patient states that his grandfather had MI at age 21s.  He has no personal history of CAD.   The history is provided by the patient.    Past Medical History:  Diagnosis Date  . Chickenpox   . Diabetes mellitus without complication (HCC)    borderline  . High cholesterol   . Hypertension     Patient Active Problem List   Diagnosis Date Noted  . NSTEMI (non-ST elevated myocardial infarction) (HCC) 03/06/2018  . HTN (hypertension) 03/13/2013  . Tobacco use disorder 03/13/2013    History reviewed. No pertinent surgical history.      Home Medications    Prior to Admission medications   Medication Sig Start Date End Date Taking? Authorizing Provider  atorvastatin (LIPITOR) 20 MG tablet Take 20 mg by mouth daily.   Yes [provider]  metFORMIN (GLUCOPHAGE) 500 MG tablet Take by mouth 2 (two) times daily with a meal.   Yes [provider]  lisinopril (PRINIVIL,ZESTRIL) 40 MG tablet Take 1 tablet (40 mg total) by mouth daily. 03/13/13   Sheliah Hatch, MD    Family History Family History  Problem Relation Age of Onset  . Hypertension Mother   . Multiple sclerosis Paternal Aunt   . Heart attack Paternal Grandfather     Social History Social History   Tobacco Use  . Smoking status: Current Every Day Smoker    Packs/day: 0.50    Years: 13.00    Pack  years: 6.50    Types: Cigarettes  Substance Use Topics  . Alcohol use: Yes    Comment: social  . Drug use: No     Allergies   Patient has no known allergies.   Review of Systems Review of Systems  Cardiovascular: Positive for chest pain.  All other systems reviewed and are negative.    Physical Exam Updated Vital Signs BP 121/81   Pulse 65   Temp 97.7 F (36.5 C) (Oral)   Resp (!) 21   Ht 5\' 11"  (1.803 m)   Wt 113.4 kg (250 lb)   SpO2 99%   BMI 34.87 kg/m   Physical Exam  Constitutional: He is oriented to person, place, and time.  Slightly uncomfortable   HENT:  Head: Normocephalic.  Eyes: Pupils are equal, round, and reactive to light. EOM are normal.  Neck: Normal range of motion.  Cardiovascular: Normal rate, regular rhythm, intact distal pulses and normal pulses.  Pulmonary/Chest: Effort normal and breath sounds normal.  No reproducible tenderness   Abdominal: Soft. Bowel sounds are normal.  Musculoskeletal: Normal range of motion.       Right lower leg: Normal. He exhibits no tenderness and no edema.       Left lower leg: Normal. He exhibits no tenderness and no edema.  Neurological: He is alert and oriented to  person, place, and time.  Skin: Skin is warm. Capillary refill takes less than 2 seconds. Clubbing: .id.  Psychiatric: He has a normal mood and affect. His behavior is normal.  Nursing note and vitals reviewed.    ED Treatments / Results  Labs (all labs ordered are listed, but only abnormal results are displayed) Labs Reviewed  TROPONIN I - Abnormal; Notable for the following components:      Result Value   Troponin I 0.28 (*)    All other components within normal limits  CBC WITH DIFFERENTIAL/PLATELET - Abnormal; Notable for the following components:   WBC 16.3 (*)    Platelets 401 (*)    Neutro Abs 14.3 (*)    All other components within normal limits  COMPREHENSIVE METABOLIC PANEL - Abnormal; Notable for the following components:    Glucose, Bld 171 (*)    Total Protein 8.5 (*)    AST 42 (*)    ALT 48 (*)    All other components within normal limits  CK  LIPASE, BLOOD  RAPID URINE DRUG SCREEN, HOSP PERFORMED  HEPARIN LEVEL (UNFRACTIONATED)    EKG EKG Interpretation  Date/Time:  Sunday March 06 2018 03:54:14 EDT Ventricular Rate:  65 PR Interval:    QRS Duration: 124 QT Interval:  434 QTC Calculation: 452 R Axis:   67 Text Interpretation:  Sinus rhythm Nonspecific intraventricular conduction delay Inferolateral infarct, old No significant change since last tracing Confirmed by Richardean Canal [phone]) on 03/06/2018 3:56:22 AM   Radiology Dg Chest 2 View  Result Date: 03/06/2018 CLINICAL DATA:  Central chest pain since yesterday. Nausea and diaphoresis. EXAM: CHEST - 2 VIEW COMPARISON:  None. FINDINGS: The heart size and mediastinal contours are within normal limits. Both lungs are clear. The visualized skeletal structures are unremarkable. IMPRESSION: No active cardiopulmonary disease. Electronically Signed   By: Burman Nieves M.D.   On: 03/06/2018 03:06    Procedures Procedures (including critical care time)  CRITICAL CARE Performed by: Richardean Canal   Total critical care time: 45 minutes  Critical care time was exclusive of separately billable procedures and treating other patients.  Critical care was necessary to treat or prevent imminent or life-threatening deterioration.  Critical care was time spent personally by me on the following activities: development of treatment plan with patient and/or surrogate as well as nursing, discussions with consultants, evaluation of patient's response to treatment, examination of patient, obtaining history from patient or surrogate, ordering and performing treatments and interventions, ordering and review of laboratory studies, ordering and review of radiographic studies, pulse oximetry and re-evaluation of patient's condition.   Medications Ordered in ED Medications   heparin ADULT infusion 100 units/mL (25000 units/260mL sodium chloride 0.45%) (1,350 Units/hr Intravenous New Bag/Given 03/06/18 0413)  sodium chloride 0.9 % bolus 1,000 mL (0 mLs Intravenous Stopped 03/06/18 0448)  aspirin chewable tablet 324 mg (324 mg Oral Given 03/06/18 0303)  nitroGLYCERIN 50 mg in dextrose 5 % 250 mL (0.2 mg/mL) infusion (70 mcg/min Intravenous Rate/Dose Change 03/06/18 0454)  nitroGLYCERIN (NITROSTAT) SL tablet 0.4 mg (0.4 mg Sublingual Given 03/06/18 0346)  heparin bolus via infusion 4,000 Units (4,000 Units Intravenous Bolus from Bag 03/06/18 0414)     Initial Impression / Assessment and Plan / ED Course  I have reviewed the triage vital signs and the nursing notes.  Pertinent labs & imaging results that were available during my care of the patient were reviewed by me and considered in my medical decision making (see  chart for details).     Danny Reid is a 37 y.o. male here with chest pain, diaphoresis. Unclear why he has chest pain. He is on lisinopril and may have hypokalemia causing muscle cramps. Consider ACS as well. I doubt PE or dissection. Consider gastritis or PUD as well. Will get labs, lipase, trop x 2, CXR. Will hydrate and give aspirin and GI cocktail and reassess.   3:30 AM Still has 10/10 pain despite ASA, GI cocktail. BP 112/70. Trop came up at 0.28. Repeat EKG showed no STEMI. I am concerned for NSTEMI. He is given nitro SL. Started on heparin drip. Will consult cardiology   3:58 AM Still 10/10 chest pain despite sublingual nitro x 3. Started on nitro and heparin drip. Repeat EKG unremarkable. I paged cardiology.   5:15 AM Attempted to reach on call doctor, Dr. Santiago Glad multiple times but was unable to reach him. I called STEMI doctor, Dr. Excell Seltzer, who accepted patient to stepdown at [facility]. Patient still has 3/10 pain despite being on nitro 70 mcg/min. Will continue to titrate nitro.   5:23 AM Pain improved on 70 mcg/min of nitro. Just pressure  sensation, no pain now. There is a bed at [facility]. He can be transferred there to see cardiology.    Final Clinical Impressions(s) / ED Diagnoses   Final diagnoses:  NSTEMI (non-ST elevated myocardial infarction) [facility])  Elevated troponin    ED Discharge Orders    None       Charlynne Pander, MD 03/06/18 [phone]

## 2018-03-06 NOTE — ED Notes (Signed)
EDP updated on pts pain score and nitroglycerin dosage. EDP instructed to hold dose at 70 mcg/min.

## 2018-03-06 NOTE — ED Notes (Signed)
RN requested urine sample. Pt states he is unable to void right now. Pt instructed to notify staff when he is able to give a sample.

## 2018-03-06 NOTE — Progress Notes (Addendum)
CRITICAL VALUE ALERT  Critical Value:Troponin 58.12  Date & Time Notied: 03/06/2018 1240   Provider Notified:   Orders Received/Actions taken:  expected result, follow trend

## 2018-03-07 ENCOUNTER — Inpatient Hospital Stay (HOSPITAL_COMMUNITY): Payer: 59

## 2018-03-07 ENCOUNTER — Inpatient Hospital Stay (HOSPITAL_COMMUNITY)
Admission: EM | Disposition: A | Discharge status: Home / Self Care | Payer: Self-pay | Source: Home / Self Care | Attending: Cardiovascular Disease

## 2018-03-07 ENCOUNTER — Encounter (HOSPITAL_COMMUNITY): Payer: Self-pay | Admitting: Cardiovascular Disease

## 2018-03-07 DIAGNOSIS — I214 Non-ST elevation (NSTEMI) myocardial infarction: Secondary | ICD-10-CM

## 2018-03-07 DIAGNOSIS — I340 Nonrheumatic mitral (valve) insufficiency: Secondary | ICD-10-CM

## 2018-03-07 HISTORY — PX: CORONARY STENT INTERVENTION: CATH118234

## 2018-03-07 LAB — BASIC METABOLIC PANEL
Anion gap: 10 (ref 5–15)
BUN: 11 mg/dL (ref 6–20)
CALCIUM: 9.1 mg/dL (ref 8.9–10.3)
CHLORIDE: 105 mmol/L (ref 98–111)
CO2: 23 mmol/L (ref 22–32)
Creatinine, Ser: 0.93 mg/dL (ref 0.61–1.24)
GFR calc Af Amer: >60 mL/min (ref >60)
GFR calc non Af Amer: >60 mL/min (ref >60)
Glucose, Bld: 119 mg/dL — ABNORMAL HIGH (ref 70–99)
Potassium: 4.3 mmol/L (ref 3.5–5.1)
Sodium: 138 mmol/L (ref 135–145)

## 2018-03-07 LAB — TROPONIN I: TROPONIN I: 29.75 ng/mL — AB (ref <0.03)

## 2018-03-07 LAB — CBC
HCT: 41.7 % (ref 39.0–52.0)
Hemoglobin: 13.6 g/dL (ref 13.0–17.0)
MCH: 28.2 pg (ref 26.0–34.0)
MCHC: 32.6 g/dL (ref 30.0–36.0)
MCV: 86.5 fL (ref 78.0–100.0)
Platelets: 353 10*3/uL (ref 150–400)
RBC: 4.82 MIL/uL (ref 4.22–5.81)
RDW: 12.2 % (ref 11.5–15.5)
WBC: 16.4 10*3/uL — ABNORMAL HIGH (ref 4.0–10.5)

## 2018-03-07 LAB — LIPID PANEL
CHOL/HDL RATIO: 4.2 ratio
CHOLESTEROL: 150 mg/dL (ref 0–200)
HDL: 36 mg/dL — AB (ref >40)
LDL Cholesterol: 70 mg/dL (ref 0–99)
Triglycerides: 222 mg/dL — ABNORMAL HIGH (ref <150)
VLDL: 44 mg/dL — AB (ref 0–40)

## 2018-03-07 LAB — GLUCOSE, CAPILLARY
GLUCOSE-CAPILLARY: 128 mg/dL — AB (ref 70–99)
GLUCOSE-CAPILLARY: 87 mg/dL (ref 70–99)
Glucose-Capillary: 109 mg/dL — ABNORMAL HIGH (ref 70–99)
Glucose-Capillary: 72 mg/dL (ref 70–99)
Glucose-Capillary: 110 mg/dL — ABNORMAL HIGH (ref 70–99)

## 2018-03-07 LAB — POCT ACTIVATED CLOTTING TIME
Activated Clotting Time: 279 seconds
Activated Clotting Time: 301 seconds
Activated Clotting Time: 312 seconds

## 2018-03-07 LAB — ECHOCARDIOGRAM COMPLETE
Height: 71 in
WEIGHTICAEL: 4160 [oz_av]

## 2018-03-07 LAB — HIV ANTIBODY (ROUTINE TESTING W REFLEX): HIV SCREEN 4TH GENERATION: NONREACTIVE

## 2018-03-07 SURGERY — CORONARY STENT INTERVENTION
Anesthesia: LOCAL

## 2018-03-07 MED ORDER — HEPARIN SODIUM (PORCINE) 1000 UNIT/ML IJ SOLN
INTRAMUSCULAR | Status: AC
  Filled 2018-03-07: qty 1

## 2018-03-07 MED ORDER — HYDRALAZINE HCL 20 MG/ML IJ SOLN
5.0000 mg | INTRAMUSCULAR | Status: AC | PRN
Start: 2018-03-07 — End: 2018-03-07

## 2018-03-07 MED ORDER — HEPARIN SODIUM (PORCINE) 1000 UNIT/ML IJ SOLN
INTRAMUSCULAR | Status: DC | PRN
  Administered 2018-03-07: 2000 [IU] via INTRAVENOUS
  Administered 2018-03-07: 10000 [IU] via INTRAVENOUS

## 2018-03-07 MED ORDER — FENTANYL CITRATE (PF) 100 MCG/2ML IJ SOLN
INTRAMUSCULAR | Status: AC
  Filled 2018-03-07: qty 2

## 2018-03-07 MED ORDER — LIDOCAINE HCL (PF) 1 % IJ SOLN
INTRAMUSCULAR | Status: DC | PRN
  Administered 2018-03-07: 2 mL

## 2018-03-07 MED ORDER — HEPARIN (PORCINE) IN NACL 2-0.9 UNITS/ML
INTRAMUSCULAR | Status: DC | PRN
  Administered 2018-03-07: 10 mL via INTRA_ARTERIAL

## 2018-03-07 MED ORDER — SODIUM CHLORIDE 0.9 % IV SOLN
INTRAVENOUS | Status: DC
  Administered 2018-03-07: 14:00:00 via INTRAVENOUS

## 2018-03-07 MED ORDER — FUROSEMIDE 10 MG/ML IJ SOLN
20.0000 mg | Freq: Once | INTRAMUSCULAR | Status: AC
  Administered 2018-03-07: 20 mg via INTRAVENOUS
  Filled 2018-03-07: qty 2

## 2018-03-07 MED ORDER — SODIUM CHLORIDE 0.9 % IV SOLN: 250.0000 mL | INTRAVENOUS | Status: DC | PRN

## 2018-03-07 MED ORDER — SODIUM CHLORIDE 0.9% FLUSH: 3.0000 mL | INTRAVENOUS | Status: DC | PRN

## 2018-03-07 MED ORDER — VERAPAMIL HCL 2.5 MG/ML IV SOLN
INTRAVENOUS | Status: AC
  Filled 2018-03-07: qty 2

## 2018-03-07 MED ORDER — MIDAZOLAM HCL 2 MG/2ML IJ SOLN
INTRAMUSCULAR | Status: DC | PRN
  Administered 2018-03-07 (×2): 1 mg via INTRAVENOUS

## 2018-03-07 MED ORDER — SODIUM CHLORIDE 0.9% FLUSH
3.0000 mL | Freq: Two times a day (BID) | INTRAVENOUS | Status: DC
  Administered 2018-03-07 – 2018-03-08 (×2): 3 mL via INTRAVENOUS

## 2018-03-07 MED ORDER — NITROGLYCERIN 1 MG/10 ML FOR IR/CATH LAB
INTRA_ARTERIAL | Status: AC
  Filled 2018-03-07: qty 10

## 2018-03-07 MED ORDER — IOPAMIDOL (ISOVUE-370) INJECTION 76%
INTRAVENOUS | Status: DC | PRN
  Administered 2018-03-07: 180 mL via INTRAVENOUS

## 2018-03-07 MED ORDER — HEPARIN (PORCINE) IN NACL 2-0.9 UNITS/ML
INTRAMUSCULAR | Status: AC | PRN
  Administered 2018-03-07 (×2): 500 mL

## 2018-03-07 MED ORDER — MIDAZOLAM HCL 2 MG/2ML IJ SOLN
INTRAMUSCULAR | Status: AC
  Filled 2018-03-07: qty 2

## 2018-03-07 MED ORDER — NITROGLYCERIN 1 MG/10 ML FOR IR/CATH LAB
INTRA_ARTERIAL | Status: DC | PRN
  Administered 2018-03-07 (×3): 100 ug via INTRA_ARTERIAL

## 2018-03-07 MED ORDER — HEPARIN (PORCINE) IN NACL 1000-0.9 UT/500ML-% IV SOLN
INTRAVENOUS | Status: AC
  Filled 2018-03-07: qty 1000

## 2018-03-07 MED ORDER — LABETALOL HCL 5 MG/ML IV SOLN: 10.0000 mg | INTRAVENOUS | Status: AC | PRN

## 2018-03-07 MED ORDER — SODIUM CHLORIDE 0.9% FLUSH
3.0000 mL | Freq: Two times a day (BID) | INTRAVENOUS | Status: DC
  Administered 2018-03-07: 3 mL via INTRAVENOUS

## 2018-03-07 MED ORDER — FENTANYL CITRATE (PF) 100 MCG/2ML IJ SOLN
INTRAMUSCULAR | Status: DC | PRN
  Administered 2018-03-07: 50 ug via INTRAVENOUS

## 2018-03-07 MED ORDER — ENOXAPARIN SODIUM 40 MG/0.4ML ~~LOC~~ SOLN
40.0000 mg | SUBCUTANEOUS | Status: DC
  Administered 2018-03-08: 40 mg via SUBCUTANEOUS
  Filled 2018-03-07: qty 0.4

## 2018-03-07 MED FILL — Heparin Sod (Porcine)-NaCl IV Soln 1000 Unit/500ML-0.9%: INTRAVENOUS | Qty: 1000 | Status: AC

## 2018-03-07 SURGICAL SUPPLY — 21 items
BALLN SAPPHIRE 2.0X12 (BALLOONS) ×2
BALLN SAPPHIRE ~~LOC~~ 2.5X10 (BALLOONS) ×2 IMPLANT
BALLN ~~LOC~~ EUPHORA RX 2.75X12 (BALLOONS) ×2
BALLOON SAPPHIRE 2.0X12 (BALLOONS) ×1 IMPLANT
BALLOON ~~LOC~~ EUPHORA RX 2.75X12 (BALLOONS) ×1 IMPLANT
CATH INFINITI JR4 5F (CATHETERS) ×2 IMPLANT
CATH LAUNCHER 6FR EBU3.5 (CATHETERS) ×2 IMPLANT
COVER PRB 48X5XTLSCP FOLD TPE (BAG) ×1 IMPLANT
COVER PROBE 5X48 (BAG) ×1
DEVICE RAD COMP TR BAND LRG (VASCULAR PRODUCTS) ×2 IMPLANT
GLIDESHEATH SLEND A-KIT 6F 22G (SHEATH) ×2 IMPLANT
GUIDEWIRE INQWIRE 1.5J.035X260 (WIRE) ×1 IMPLANT
INQWIRE 1.5J .035X260CM (WIRE) ×2
KIT ENCORE 26 ADVANTAGE (KITS) ×4 IMPLANT
KIT HEART LEFT (KITS) ×2 IMPLANT
PACK CARDIAC CATHETERIZATION (CUSTOM PROCEDURE TRAY) ×2 IMPLANT
STENT RESOLUTE ONYX 2.25X15 (Permanent Stent) ×2 IMPLANT
STENT RESOLUTE ONYX 2.5X12 (Permanent Stent) ×2 IMPLANT
TRANSDUCER W/STOPCOCK (MISCELLANEOUS) ×2 IMPLANT
TUBING CIL FLEX 10 FLL-RA (TUBING) ×2 IMPLANT
WIRE MARVEL STR TIP 190CM (WIRE) ×2 IMPLANT

## 2018-03-07 NOTE — Progress Notes (Signed)
Progress Note  Patient Name: Danny Reid Date of Encounter: 03/07/2018  Primary Cardiologist: No primary care provider on file.   Subjective   Feels well this morning. No further chest pain or shortness of breath. Some residual chest pain yesterday during period of reperfusion arrhythmia.   Inpatient Medications    Scheduled Meds: . aspirin EC  81 mg Oral Daily  . atorvastatin  80 mg Oral q1800  . heparin  5,000 Units Subcutaneous Q8H  . insulin aspart  0-15 Units Subcutaneous TID WC  . metoprolol tartrate  12.5 mg Oral BID  . sodium chloride flush  3 mL Intravenous Q12H  . ticagrelor  90 mg Oral BID   Continuous Infusions: . sodium chloride Stopped (03/06/18 2331)   PRN Meds: sodium chloride, acetaminophen, morphine injection, nitroGLYCERIN, ondansetron (ZOFRAN) IV, sodium chloride flush   Vital Signs    Vitals:   03/07/18 0100 03/07/18 0200 03/07/18 0300 03/07/18 0400  BP: (!) 100/58 97/62 100/60 97/60  Pulse: 70 69 65 65  Resp: (!) 25 20 (!) 24 (!) 26  Temp:    97.6 F (36.4 C)  TempSrc:    Oral  SpO2: 91% 94% 93% 95%  Weight:      Height:        Intake/Output Summary (Last 24 hours) at 03/07/2018 0706 Last data filed at 03/07/2018 0200 Gross per 24 hour  Intake 333.72 ml  Output 1625 ml  Net -1291.28 ml   Filed Weights   03/06/18 0243 03/06/18 0649 03/06/18 1000  Weight: 250 lb (113.4 kg) 257 lb 15 oz (117 kg) 264 lb 12.4 oz (120.1 kg)    Telemetry    Sinus rhythm, few PVC's - Personally Reviewed  ECG    NSR with inferior/lateral MI age undetermined - Personally Reviewed  Physical Exam  Pleasant obese male NAD GEN: No acute distress.   Neck: No JVD Cardiac: RRR, no murmurs, rubs, or gallops.  Respiratory: Clear to auscultation bilaterally. GI: Soft, nontender, non-distended  MS: No edema; No deformity. Right radial site clear Neuro:  Nonfocal  Psych: Normal affect   Labs    Chemistry Recent Labs  Lab 03/06/18 0246 03/06/18 1054  03/07/18 0233  NA 139  --  138  K 4.3  --  4.3  CL 100  --  105  CO2 29  --  23  GLUCOSE 171*  --  119*  BUN 11  --  11  CREATININE 1.08 0.92 0.93  CALCIUM 9.9  --  9.1  PROT 8.5*  --   --   ALBUMIN 4.9  --   --   AST 42*  --   --   ALT 48*  --   --   ALKPHOS 55  --   --   BILITOT 0.8  --   --   GFRNONAA >60 >60 >60  GFRAA >60 >60 >60  ANIONGAP 10  --  10     Hematology Recent Labs  Lab 03/06/18 1652 03/06/18 1901 03/07/18 0233  WBC 17.8* 16.2* 16.4*  RBC 4.83 4.65 4.82  HGB 13.8 12.9* 13.6  HCT 41.5 40.1 41.7  MCV 85.9 86.2 86.5  MCH 28.6 27.7 28.2  MCHC 33.3 32.2 32.6  RDW 12.5 12.5 12.2  PLT PLATELET CLUMPS NOTED ON SMEAR, COUNT APPEARS ADEQUATE 371 353    Cardiac Enzymes Recent Labs  Lab 03/06/18 0722 03/06/18 1054 03/06/18 1923 03/07/18 0233  TROPONINI 1.81* 58.12* 49.35* 29.75*   No results for input(s): TROPIPOC in  the last 168 hours.   BNP Recent Labs  Lab 03/06/18 1049  BNP 81.6     DDimer No results for input(s): DDIMER in the last 168 hours.   Radiology    Dg Chest 2 View  Result Date: 03/06/2018 CLINICAL DATA:  Central chest pain since yesterday. Nausea and diaphoresis. EXAM: CHEST - 2 VIEW COMPARISON:  None. FINDINGS: The heart size and mediastinal contours are within normal limits. Both lungs are clear. The visualized skeletal structures are unremarkable. IMPRESSION: No active cardiopulmonary disease. Electronically Signed   By: Burman Nieves M.D.   On: 03/06/2018 03:06    Cardiac Studies   Cardiac Cath 03-06-18: Conclusion     Dist RCA lesion is 100% stenosed.  Ost RPDA to RPDA lesion is 75% stenosed.  There is moderate left ventricular systolic dysfunction.  LV end diastolic pressure is moderately elevated.  The left ventricular ejection fraction is 35-45% by visual estimate.  Ost Cx to Prox Cx lesion is 40% stenosed.  Prox Cx to Mid Cx lesion is 90% stenosed.  Ramus lesion is 50% stenosed.  A drug-eluting stent  was successfully placed using a STENT RESOLUTE ONYX 3.0X18.  Post intervention, there is a 0% residual stenosis.  Post intervention, there is a 0% residual stenosis.  A drug-eluting stent was successfully placed using a STENT RESOLUTE ONYX 4.0X26.   1.  Acute total occlusion of the distal RCA with heavy thrombus, with severe stenosis extending into the distal RCA bifurcation of the PDA and PLA branches 2.  Severe stenosis of the mid left circumflex extending into the second obtuse marginal branch 3.  Mild nonobstructive disease of the left main, LAD, and ramus intermedius 4.  Moderate segmental LV dysfunction with akinesis of the entire inferior wall, LVEF estimated at approximately 40% 5.  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  Successful PCI of the distal RCA and ostial PDA with overlapping drug-eluting stents  Recommendations: Continue Aggrastat x12 hours.  The patient is loaded with ticagrelor 180 mg in the cardiac Cath Lab.  Aggressive secondary risk reduction measures.  Recommend uninterrupted dual antiplatelet therapy with Aspirin 81mg  daily and Ticagrelor 90mg  twice daily for a minimum of 12 months (ACS - Class I recommendation).     Patient Profile     37 y.o. male with obesity, HTN, hyperlipidemia, diabetes, and tobacco use, presenting with inferior MI treated with PCI 03/06/18  Assessment & Plan    1. Acute inferior MI: treated with stenting of the RCA/PDA using overlapping DES. Continue ASA and ticagrelor at least 12 months. Aggressive risk reduction measures (see below). Diet/lifestyle modification discussed at length. 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 Planned staged circumflex PCI this afternoon pending cath lab availability. Lesion is high risk with ulcerated appearance and very tight stenosis - warrants inpatient PCI. Reviewed risks/indications for the patient.   2.  Hyperlipidemia: atorvastatin 80 mg. LDL 70 on background of atorvasstatin 20 mg.  3. HTN: BP soft today in the 90-100 mmHg range. Hold ACE, continue low-dose beta-blocker.   4. Diabetes: continue SSI in hospital. Lifestyle modification discussed.   Dispo: anticipate home tomorrow if PCI completed today.   For questions or updates, please contact [facility] Please consult [email or web address] for contact info under Cardiology/STEMI.   Signed, Tonny Bollman, MD  03/07/2018, 7:06 AM

## 2018-03-07 NOTE — Progress Notes (Signed)
Will hold ambulation today due to eccentric Cx lesion. Began ed with pt, very receptive. Discussed MI, stents, Brilinta, and restrictions. He sts he quit smoking cigarettes about 8 years ago. He does smoke one cigar weekly. Gave him diet sheets to begin reading. His DM is controlled but he does eat out mostly. He will read information and we will discuss tomorrow. He works two jobs. [id number] Ethelda Chick CES, ACSM 11:23 AM 03/07/2018

## 2018-03-07 NOTE — Progress Notes (Signed)
Per insurance check for Brilinta  # 2.  S/W PARYTI  @ [employer] # [phone]    BRILINTA 90 MG BID  COVER- YES  CO-PAY- $ 60.00  TIER- 3 DRUG  PRIOR APPROVAL- NO    PREFERRED PHARMACY : [facility] OR [facility]

## 2018-03-07 NOTE — Research (Signed)
AEGIS II research study : Spoke to patient in detail about AEGIS II research study. Patient states he would like to participate, ICF left for review. Patient is having Staged PCI today. Research will follow up tomorrow.

## 2018-03-07 NOTE — Progress Notes (Signed)
  Echocardiogram 2D Echocardiogram has been performed.  Danny Reid 03/07/2018, 10:47 AM

## 2018-03-07 NOTE — Interval H&P Note (Signed)
History and Physical Interval Note:  03/07/2018 2:40 PM  Danny Reid  has presented today for cardiac catheterization, with the diagnosis of NSTEMI. The various methods of treatment have been discussed with the patient and family. After consideration of risks, benefits and other options for treatment, the patient has consented to  Procedure(s): CORONARY STENT INTERVENTION (N/A) as a surgical intervention .  The patient's history has been reviewed, patient examined, no change in status, stable for surgery.  I have reviewed the patient's chart and labs.  Questions were answered to the patient's satisfaction.    Cath Lab Visit (complete for each Cath Lab visit)  Clinical Evaluation Leading to the Procedure:   ACS: Yes.    Non-ACS:  N/A  [name]

## 2018-03-07 NOTE — Plan of Care (Signed)
  Problem: Education: Goal: Understanding of cardiac disease, CV risk reduction, and recovery process will improve Outcome: Progressing Goal: Understanding of medication regimen will improve Outcome: Progressing   Problem: Activity: Goal: Ability to tolerate increased activity will improve Outcome: Progressing   Problem: Cardiac: Goal: Ability to achieve and maintain adequate cardiopulmonary perfusion will improve Outcome: Progressing Goal: Vascular access site(s) Level 0-1 will be maintained Outcome: Progressing   Problem: Health Behavior/Discharge Planning: Goal: Ability to safely manage health-related needs after discharge will improve Outcome: Progressing   Problem: Education: Goal: Knowledge of General Education information will improve Outcome: Progressing   Problem: Clinical Measurements: Goal: Ability to maintain clinical measurements within normal limits will improve Outcome: Progressing Goal: Will remain free from infection Outcome: Progressing Goal: Diagnostic test results will improve Outcome: Progressing Goal: Respiratory complications will improve Outcome: Progressing Goal: Cardiovascular complication will be avoided Outcome: Progressing   Problem: Activity: Goal: Risk for activity intolerance will decrease Outcome: Progressing   Problem: Nutrition: Goal: Adequate nutrition will be maintained Outcome: Progressing   Problem: Elimination: Goal: Will not experience complications related to bowel motility Outcome: Progressing Goal: Will not experience complications related to urinary retention Outcome: Progressing   Problem: Pain Managment: Goal: General experience of comfort will improve Outcome: Progressing   Problem: Safety: Goal: Ability to remain free from injury will improve Outcome: Progressing

## 2018-03-07 NOTE — H&P (View-Only) (Signed)
 Progress Note  Patient Name: Danny Reid Date of Encounter: 03/07/2018  Primary Cardiologist: No primary care provider on file.   Subjective   Feels well this morning. No further chest pain or shortness of breath. Some residual chest pain yesterday during period of reperfusion arrhythmia.   Inpatient Medications    Scheduled Meds: . aspirin EC  81 mg Oral Daily  . atorvastatin  80 mg Oral q1800  . heparin  5,000 Units Subcutaneous Q8H  . insulin aspart  0-15 Units Subcutaneous TID WC  . metoprolol tartrate  12.5 mg Oral BID  . sodium chloride flush  3 mL Intravenous Q12H  . ticagrelor  90 mg Oral BID   Continuous Infusions: . sodium chloride Stopped (03/06/18 2331)   PRN Meds: sodium chloride, acetaminophen, morphine injection, nitroGLYCERIN, ondansetron (ZOFRAN) IV, sodium chloride flush   Vital Signs    Vitals:   03/07/18 0100 03/07/18 0200 03/07/18 0300 03/07/18 0400  BP: (!) 100/58 97/62 100/60 97/60  Pulse: 70 69 65 65  Resp: (!) 25 20 (!) 24 (!) 26  Temp:    97.6 F (36.4 C)  TempSrc:    Oral  SpO2: 91% 94% 93% 95%  Weight:      Height:        Intake/Output Summary (Last 24 hours) at 03/07/2018 0706 Last data filed at 03/07/2018 0200 Gross per 24 hour  Intake 333.72 ml  Output 1625 ml  Net -1291.28 ml   Filed Weights   03/06/18 0243 03/06/18 0649 03/06/18 1000  Weight: 250 lb (113.4 kg) 257 lb 15 oz (117 kg) 264 lb 12.4 oz (120.1 kg)    Telemetry    Sinus rhythm, few PVC's - Personally Reviewed  ECG    NSR with inferior/lateral MI age undetermined - Personally Reviewed  Physical Exam  Pleasant obese male NAD GEN: No acute distress.   Neck: No JVD Cardiac: RRR, no murmurs, rubs, or gallops.  Respiratory: Clear to auscultation bilaterally. GI: Soft, nontender, non-distended  MS: No edema; No deformity. Right radial site clear Neuro:  Nonfocal  Psych: Normal affect   Labs    Chemistry Recent Labs  Lab 03/06/18 0246 03/06/18 1054  03/07/18 0233  NA 139  --  138  K 4.3  --  4.3  CL 100  --  105  CO2 29  --  23  GLUCOSE 171*  --  119*  BUN 11  --  11  CREATININE 1.08 0.92 0.93  CALCIUM 9.9  --  9.1  PROT 8.5*  --   --   ALBUMIN 4.9  --   --   AST 42*  --   --   ALT 48*  --   --   ALKPHOS 55  --   --   BILITOT 0.8  --   --   GFRNONAA >60 >60 >60  GFRAA >60 >60 >60  ANIONGAP 10  --  10     Hematology Recent Labs  Lab 03/06/18 1652 03/06/18 1901 03/07/18 0233  WBC 17.8* 16.2* 16.4*  RBC 4.83 4.65 4.82  HGB 13.8 12.9* 13.6  HCT 41.5 40.1 41.7  MCV 85.9 86.2 86.5  MCH 28.6 27.7 28.2  MCHC 33.3 32.2 32.6  RDW 12.5 12.5 12.2  PLT PLATELET CLUMPS NOTED ON SMEAR, COUNT APPEARS ADEQUATE 371 353    Cardiac Enzymes Recent Labs  Lab 03/06/18 0722 03/06/18 1054 03/06/18 1923 03/07/18 0233  TROPONINI 1.81* 58.12* 49.35* 29.75*   No results for input(s): TROPIPOC in   the last 168 hours.   BNP Recent Labs  Lab 03/06/18 1049  BNP 81.6     DDimer No results for input(s): DDIMER in the last 168 hours.   Radiology    Dg Chest 2 View  Result Date: 03/06/2018 CLINICAL DATA:  Central chest pain since yesterday. Nausea and diaphoresis. EXAM: CHEST - 2 VIEW COMPARISON:  None. FINDINGS: The heart size and mediastinal contours are within normal limits. Both lungs are clear. The visualized skeletal structures are unremarkable. IMPRESSION: No active cardiopulmonary disease. Electronically Signed   By: William  Stevens M.D.   On: 03/06/2018 03:06    Cardiac Studies   Cardiac Cath 03-06-18: Conclusion     Dist RCA lesion is 100% stenosed.  Ost RPDA to RPDA lesion is 75% stenosed.  There is moderate left ventricular systolic dysfunction.  LV end diastolic pressure is moderately elevated.  The left ventricular ejection fraction is 35-45% by visual estimate.  Ost Cx to Prox Cx lesion is 40% stenosed.  Prox Cx to Mid Cx lesion is 90% stenosed.  Ramus lesion is 50% stenosed.  A drug-eluting stent  was successfully placed using a STENT RESOLUTE ONYX 3.0X18.  Post intervention, there is a 0% residual stenosis.  Post intervention, there is a 0% residual stenosis.  A drug-eluting stent was successfully placed using a STENT RESOLUTE ONYX 4.0X26.   1.  Acute total occlusion of the distal RCA with heavy thrombus, with severe stenosis extending into the distal RCA bifurcation of the PDA and PLA branches 2.  Severe stenosis of the mid left circumflex extending into the second obtuse marginal branch 3.  Mild nonobstructive disease of the left main, LAD, and ramus intermedius 4.  Moderate segmental LV dysfunction with akinesis of the entire inferior wall, LVEF estimated at approximately 40% 5.  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  Successful PCI of the distal RCA and ostial PDA with overlapping drug-eluting stents  Recommendations: Continue Aggrastat x12 hours.  The patient is loaded with ticagrelor 180 mg in the cardiac Cath Lab.  Aggressive secondary risk reduction measures.  Recommend uninterrupted dual antiplatelet therapy with Aspirin 81mg daily and Ticagrelor 90mg twice daily for a minimum of 12 months (ACS - Class I recommendation).     Patient Profile     37 y.o. male with obesity, HTN, hyperlipidemia, diabetes, and tobacco use, presenting with inferior MI treated with PCI 03/06/18  Assessment & Plan    1. Acute inferior MI: treated with stenting of the RCA/PDA using overlapping DES. Continue ASA and ticagrelor at least 12 months. Aggressive risk reduction measures (see below). Diet/lifestyle modification discussed at length. 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 Planned staged circumflex PCI this afternoon pending cath lab availability. Lesion is high risk with ulcerated appearance and very tight stenosis - warrants inpatient PCI. Reviewed risks/indications for the patient.   2.  Hyperlipidemia: atorvastatin 80 mg. LDL 70 on background of atorvasstatin 20 mg.  3. HTN: BP soft today in the 90-100 mmHg range. Hold ACE, continue low-dose beta-blocker.   4. Diabetes: continue SSI in hospital. Lifestyle modification discussed.   Dispo: anticipate home tomorrow if PCI completed today.   For questions or updates, please contact [facility] Please consult [email or web address] for contact info under Cardiology/STEMI.   Signed, Katurah Karapetian, MD  03/07/2018, 7:06 AM    

## 2018-03-08 ENCOUNTER — Encounter (HOSPITAL_COMMUNITY): Payer: Self-pay | Admitting: Internal Medicine

## 2018-03-08 ENCOUNTER — Telehealth: Payer: Self-pay | Admitting: Nurse Practitioner

## 2018-03-08 DIAGNOSIS — E7800 Pure hypercholesterolemia, unspecified: Secondary | ICD-10-CM

## 2018-03-08 DIAGNOSIS — I10 Essential (primary) hypertension: Secondary | ICD-10-CM

## 2018-03-08 LAB — BASIC METABOLIC PANEL
ANION GAP: 14 (ref 5–15)
Anion gap: 9 (ref 5–15)
BUN: 12 mg/dL (ref 6–20)
BUN: 12 mg/dL (ref 6–20)
CALCIUM: 8.9 mg/dL (ref 8.9–10.3)
CALCIUM: 9.4 mg/dL (ref 8.9–10.3)
CO2: 21 mmol/L — ABNORMAL LOW (ref 22–32)
CO2: 28 mmol/L (ref 22–32)
Chloride: 102 mmol/L (ref 98–111)
Chloride: 101 mmol/L (ref 98–111)
Creatinine, Ser: 1.03 mg/dL (ref 0.61–1.24)
Creatinine, Ser: 1.07 mg/dL (ref 0.61–1.24)
Glucose, Bld: 119 mg/dL — ABNORMAL HIGH (ref 70–99)
Glucose, Bld: 153 mg/dL — ABNORMAL HIGH (ref 70–99)
Potassium: 3.9 mmol/L (ref 3.5–5.1)
Potassium: 4 mmol/L (ref 3.5–5.1)
SODIUM: 138 mmol/L (ref 135–145)
Sodium: 137 mmol/L (ref 135–145)

## 2018-03-08 LAB — GLUCOSE, CAPILLARY
GLUCOSE-CAPILLARY: 105 mg/dL — AB (ref 70–99)
Glucose-Capillary: 110 mg/dL — ABNORMAL HIGH (ref 70–99)

## 2018-03-08 LAB — CBC
HCT: 44.6 % (ref 39.0–52.0)
Hemoglobin: 14.2 g/dL (ref 13.0–17.0)
MCH: 27.4 pg (ref 26.0–34.0)
MCHC: 31.8 g/dL (ref 30.0–36.0)
MCV: 86.1 fL (ref 78.0–100.0)
Platelets: 345 10*3/uL (ref 150–400)
RBC: 5.18 MIL/uL (ref 4.22–5.81)
RDW: 11.9 % (ref 11.5–15.5)
WBC: 13.8 10*3/uL — ABNORMAL HIGH (ref 4.0–10.5)

## 2018-03-08 MED ORDER — STUDY - AEGIS II STUDY - PLACEBO OR CSL112 (PI-HILTY)
170.0000 mL | INTRAVENOUS | Status: DC
  Filled 2018-03-08 (×2): qty 170

## 2018-03-08 MED ORDER — ATORVASTATIN CALCIUM 80 MG PO TABS: 80.0000 mg | ORAL_TABLET | Freq: Every day | ORAL | 2 refills | Status: DC

## 2018-03-08 MED ORDER — ASPIRIN 81 MG PO TBEC: 81.0000 mg | DELAYED_RELEASE_TABLET | Freq: Every day | ORAL | Status: DC

## 2018-03-08 MED ORDER — NITROGLYCERIN 0.4 MG SL SUBL: 0.4000 mg | SUBLINGUAL_TABLET | SUBLINGUAL | 2 refills | Status: AC | PRN

## 2018-03-08 MED ORDER — METOPROLOL TARTRATE 25 MG PO TABS: 12.5000 mg | ORAL_TABLET | Freq: Two times a day (BID) | ORAL | 2 refills | Status: DC

## 2018-03-08 MED ORDER — TICAGRELOR 90 MG PO TABS: 90.0000 mg | ORAL_TABLET | Freq: Two times a day (BID) | ORAL | 2 refills | Status: DC

## 2018-03-08 MED FILL — Heparin Sod (Porcine)-NaCl IV Soln 1000 Unit/500ML-0.9%: INTRAVENOUS | Qty: 500 | Status: AC

## 2018-03-08 NOTE — Care Management Note (Signed)
Case Management Note  Patient Details  Name: Dayron Dietert MRN: [id number] Date of Birth: 30-Jul-1981  Subjective/Objective:   MI, HTN               Action/Plan: Provided pt with Brilinta $5 copay card to take to his pharmacy. Attempted call to [facility] x 3, no answer to check to see if medication in stock. Pt provided a note for work. Discussed with pt how to complete FMLA paperwork. Explained he will need to arrange follow up appt with PCP, Hayden Rasmussen PA.   Expected Discharge Date:  03/08/18               Expected Discharge Plan:  Home/Self Care  In-House Referral:  NA  Discharge planning Services  CM Consult, Medication Assistance  Post Acute Care Choice:  NA Choice offered to:  NA  DME Arranged:  N/A DME Agency:  NA  HH Arranged:  NA HH Agency:  NA  Status of Service:  Completed, signed off  If discussed at Long Length of Stay Meetings, dates discussed:    Additional Comments:  Elliot Cousin, RN 03/08/2018, 3:28 PM

## 2018-03-08 NOTE — Telephone Encounter (Signed)
TOC Patient- Please call Patient- Pt has an appointment on 03-29-18 with Norma Fredrickson.

## 2018-03-08 NOTE — Plan of Care (Signed)
Patient progressing well, will be participating in a clinical trial and has gone over information with the MD and the RN involved in the study. Patient has no complaints of pain, remains stable, has no questions at this time. Discharge is planned for this afternoon once his infusion is complete and post labs have been drawn.  Follow up will be determined by the study team as well as the cardiology specialist.  

## 2018-03-08 NOTE — Progress Notes (Signed)
CARDIAC REHAB PHASE I   PRE:  Rate/Rhythm: 80 SR    BP: sitting 117/84    SaO2:   MODE:  Ambulation: 740 ft   POST:  Rate/Rhythm: 82 SR    BP: sitting 118/101     SaO2:   Tolerated well, no c/o. Ed completed with good reception.  Understands importance of Brilinta. Will refer to [place] [facility] (works here). He is very receptive to risk factor modification and motivated.  [id number]  Harriet Masson CES, ACSM 03/08/2018 11:27 AM

## 2018-03-08 NOTE — Progress Notes (Signed)
Progress Note  Patient Name: Danny Reid Date of Encounter: 03/08/2018  Primary Cardiologist: Excell Seltzer  Subjective   No chest pain or dyspnea.   Inpatient Medications    Scheduled Meds: . aspirin EC  81 mg Oral Daily  . atorvastatin  80 mg Oral q1800  . enoxaparin (LOVENOX) injection  40 mg Subcutaneous Q24H  . insulin aspart  0-15 Units Subcutaneous TID WC  . metoprolol tartrate  12.5 mg Oral BID  . sodium chloride flush  3 mL Intravenous Q12H  . sodium chloride flush  3 mL Intravenous Q12H  . ticagrelor  90 mg Oral BID   Continuous Infusions: . sodium chloride Stopped (03/06/18 2331)  . sodium chloride     PRN Meds: sodium chloride, sodium chloride, acetaminophen, morphine injection, nitroGLYCERIN, ondansetron (ZOFRAN) IV, sodium chloride flush, sodium chloride flush   Vital Signs    Vitals:   03/08/18 0500 03/08/18 0600 03/08/18 0700 03/08/18 0742  BP: 99/69 98/68 119/76   Pulse: 80 77 74   Resp: 18 18 14    Temp:    98.5 F (36.9 C)  TempSrc:    Oral  SpO2: 95% 96% 95%   Weight:      Height:        Intake/Output Summary (Last 24 hours) at 03/08/2018 0803 Last data filed at 03/08/2018 0600 Gross per 24 hour  Intake 240 ml  Output 700 ml  Net -460 ml   Filed Weights   03/06/18 0649 03/06/18 1000 03/07/18 0721  Weight: 257 lb 15 oz (117 kg) 264 lb 12.4 oz (120.1 kg) 260 lb (117.9 kg)    Telemetry    sinus - Personally Reviewed  ECG    Sinus, completed inferior MI with Q waves. Anterolateral TWI- Personally Reviewed  Physical Exam   GEN: No acute distress.   Neck: No JVD Cardiac: RRR, no murmurs, rubs, or gallops.  Respiratory: Clear to auscultation bilaterally. GI: Soft, nontender, non-distended  MS: No edema; No deformity. Neuro:  Nonfocal  Psych: Normal affect   Labs    Chemistry Recent Labs  Lab 03/06/18 0246 03/06/18 1054 03/07/18 0233 03/08/18 0244  NA 139  --  138 137  K 4.3  --  4.3 3.9  CL 100  --  105 102  CO2 29  --  23  21*  GLUCOSE 171*  --  119* 119*  BUN 11  --  11 12  CREATININE 1.08 0.92 0.93 1.03  CALCIUM 9.9  --  9.1 8.9  PROT 8.5*  --   --   --   ALBUMIN 4.9  --   --   --   AST 42*  --   --   --   ALT 48*  --   --   --   ALKPHOS 55  --   --   --   BILITOT 0.8  --   --   --   GFRNONAA >60 >60 >60 >60  GFRAA >60 >60 >60 >60  ANIONGAP 10  --  10 14     Hematology Recent Labs  Lab 03/06/18 1901 03/07/18 0233 03/08/18 0244  WBC 16.2* 16.4* 13.8*  RBC 4.65 4.82 5.18  HGB 12.9* 13.6 14.2  HCT 40.1 41.7 44.6  MCV 86.2 86.5 86.1  MCH 27.7 28.2 27.4  MCHC 32.2 32.6 31.8  RDW 12.5 12.2 11.9  PLT 371 353 345    Cardiac Enzymes Recent Labs  Lab 03/06/18 0722 03/06/18 1054 03/06/18 1923 03/07/18 0233  TROPONINI  1.81* 58.12* 49.35* 29.75*   No results for input(s): TROPIPOC in the last 168 hours.   BNP Recent Labs  Lab 03/06/18 1049  BNP 81.6     DDimer No results for input(s): DDIMER in the last 168 hours.   Radiology    No results found.  Cardiac Studies   Cardiac Cath 03-06-18: Conclusion     Dist RCA lesion is 100% stenosed.  Ost RPDA to RPDA lesion is 75% stenosed.  There is moderate left ventricular systolic dysfunction.  LV end diastolic pressure is moderately elevated.  The left ventricular ejection fraction is 35-45% by visual estimate.  Ost Cx to Prox Cx lesion is 40% stenosed.  Prox Cx to Mid Cx lesion is 90% stenosed.  Ramus lesion is 50% stenosed.  A drug-eluting stent was successfully placed using a STENT RESOLUTE ONYX 3.0X18.  Post intervention, there is a 0% residual stenosis.  Post intervention, there is a 0% residual stenosis.  A drug-eluting stent was successfully placed using a STENT RESOLUTE ONYX 4.0X26.  1. Acute total occlusion of the distal RCA with heavy thrombus, with severe stenosis extending into the distal RCA bifurcation of the PDA and PLA branches 2. Severe stenosis of the mid left circumflex extending into the second  obtuse marginal branch 3. Mild nonobstructive disease of the left main, LAD, and ramus intermedius 4. Moderate segmental LV dysfunction with akinesis of the entire inferior wall, LVEF estimated at approximately 40% 5. 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 Successful PCI of the distal RCA and ostial PDA with overlapping drug-eluting stents  Recommendations: Continue Aggrastat x12 hours. The patient is loaded with ticagrelor 180 mg in the cardiac Cath Lab. Aggressive secondary risk reduction measures.  Recommend uninterrupted dual antiplatelet therapy with Aspirin 81mg  daily and Ticagrelor 90mg  twice dailyfor a minimum of 12 months (ACS - Class I recommendation).    Echo 03/07/18: - Left ventricle: The cavity size was normal. Wall thickness was   increased in a pattern of mild LVH. Systolic function was mildly   reduced. The estimated ejection fraction was in the range of 45%   to 50%. There is hypokinesis of the inferolateral myocardium. The   study is not technically sufficient to allow evaluation of LV   diastolic function. - Aortic valve: There was trivial regurgitation. - Mitral valve: There was mild regurgitation.  Impressions:  - Hypokinesis of the inferolateral wall with overall mild LV   dysfunction; mild LVH; trace AI; mild MR.  Patient Profile     37 y.o. male with history of obesity, HTN, HLD, DM and tobacco abuse admitted with an inferior STEMI treated with emergent PCI and stenting of the RCA followed by staged PCI of the Circumflex.   Assessment & Plan    1. CAD/Inferior STEMI: He is now s/p stenting of the RCA and PDA with overlapping DES and staged PCI of the Circumflex with overlapping DES. Will continue DAPT with ASA and Brilinta for one year post MI. Continue statin and beta blocker. Will add Ace-inh at follow up (BP too soft to add Ace-inh now).   2. HTN: BP is still soft on low  dose beta blocker. Will not start Ace-inh yet. Will plan to start Ace-inh at office follow up if BP stable.    3. HLD: Continue high intensity statin.   4. DM: Continue SSI.   5. Ischemic cardiomyopathy: LVEF=45-50% post MI. Will continue beta blocker. No Ace-inh yet with soft  BP. Volume status is ok this am.   Discharge home today after he works with cardiac rehab. Follow up in the office in one week with office APP then [facility].   For questions or updates, please contact [facility] Please consult [email or web address] for contact info under Cardiology/STEMI.      Signed, Verne Carrow, MD  03/08/2018, 8:03 AM

## 2018-03-08 NOTE — Discharge Summary (Signed)
Discharge Summary    Patient ID: Fermin Yan,  MRN: [id number], DOB/AGE: 01-05-1981 37 y.o.  Admit date: 03/06/2018 Discharge date: 03/08/2018  Primary Care Provider: No primary care provider on file. Primary Cardiologist: Dr. Excell Seltzer   Discharge Diagnoses    Principal Problem:   Acute inferior myocardial infarction [facility]) Active Problems:   HTN (hypertension)   Tobacco use disorder   Hyperlipidemia  Allergies No Known Allergies  Diagnostic Studies/Procedures    Cath: 03/06/18  Conclusion     Dist RCA lesion is 100% stenosed.  Ost RPDA to RPDA lesion is 75% stenosed.  There is moderate left ventricular systolic dysfunction.  LV end diastolic pressure is moderately elevated.  The left ventricular ejection fraction is 35-45% by visual estimate.  Ost Cx to Prox Cx lesion is 40% stenosed.  Prox Cx to Mid Cx lesion is 90% stenosed.  Ramus lesion is 50% stenosed.  A drug-eluting stent was successfully placed using a STENT RESOLUTE ONYX 3.0X18.  Post intervention, there is a 0% residual stenosis.  Post intervention, there is a 0% residual stenosis.  A drug-eluting stent was successfully placed using a STENT RESOLUTE ONYX 4.0X26.   1.  Acute total occlusion of the distal RCA with heavy thrombus, with severe stenosis extending into the distal RCA bifurcation of the PDA and PLA branches 2.  Severe stenosis of the mid left circumflex extending into the second obtuse marginal branch 3.  Mild nonobstructive disease of the left main, LAD, and ramus intermedius 4.  Moderate segmental LV dysfunction with akinesis of the entire inferior wall, LVEF estimated at approximately 40% 5.  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  Successful PCI of the distal RCA and ostial PDA with overlapping drug-eluting stents  Recommendations: Continue Aggrastat x12 hours.  The patient is loaded with ticagrelor 180 mg in  the cardiac Cath Lab.  Aggressive secondary risk reduction measures.  Recommend uninterrupted dual antiplatelet therapy with Aspirin 81mg  daily and Ticagrelor 90mg  twice daily for a minimum of 12 months (ACS - Class I recommendation).   Cath: 03/07/18   Conclusion   Conclusions: 1. Multivessel CAD, unchanged from the conclusion of yesterday's LHC/PCI, including severe mid/distal LCx disease. 2. Widely patent distal RCA/rPDA stents with improved flow in the distal branches following tirofiban therapy. 3. Moderately elevated left ventricular filling pressure (LVEDP ~25 mmHg). 4. Successful PCI to proximal/mid LCx extending into ostium of OM3 using overlapping Resolute Onyx 2.5 x 12 mm (proximal) and Resolute Onyx 2.25 x 15 mm (distal) drug eluting stents with 0% residual stenosis and TIMI-3 flow.  Recommendations: 1. Aggressive secondary prevention. 2. Administer furosemide 20 mg IV x 1 given moderately elevated LVEDP in the setting of moderately reduced LVEF. 3. Optimize evidence-based heart failure therapy, as heart rate, blood pressure, and renal function allow. 4. Likely discharge home tomorrow if stable overnight.  Recommend uninterrupted dual antiplatelet therapy with Aspirin 81mg  daily and Ticagrelor 90mg  twice daily for a minimum of 12 months (ACS - Class I recommendation).   Yvonne Kendall, MD   TTE: 03/07/18  Study Conclusions  - Left ventricle: The cavity size was normal. Wall thickness was   increased in a pattern of mild LVH. Systolic function was mildly   reduced. The estimated ejection fraction was in the range of 45%   to 50%. There is hypokinesis of the inferolateral myocardium. The   study is not technically sufficient to allow evaluation of LV  diastolic function. - Aortic valve: There was trivial regurgitation. - Mitral valve: There was mild regurgitation.  Impressions:  - Hypokinesis of the inferolateral wall with overall mild LV   dysfunction; mild  LVH; trace AI; mild MR. _____________   History of Present Illness    Mr. Mignogna is a 37 yo male with PMH of DM, HTN, and HL who initially presented to med [facility] with chest pain.  ER evaluation demonstrated a troponin of 0.28, total CK of 383, and EKG demonstrating inferior infarct pattern with sub-millimeter ST elevation not diagnostic of STEMI.  The patient was transferred to [facility] for further evaluation.  He was treated with IV heparin and nitroglycerin with improvement in his chest pain symptoms.  However, on arrival here he developed recurrent 10/10 chest pain.  At the time of evaluation, his mother was at the bedside.  The patient had ongoing substernal chest pain that felt like a "pressure."  He denied any radiation of the pain.  He denied shortness of breath, heart palpitations, orthopnea, PND, or leg swelling.  He admitted to diaphoresis, nausea, and vomiting.  Reported that he vomited throughout the night.  His pain started at 8 PM the night prior and had been continuous since that time.  He had no past history of cardiac problems. He was taken directly to the cath lab for emergent cardiac cath.   Hospital Course      Underwent cardiac cath noted above with Dr. Excell Seltzer with stenting of the RCA/PDA using overlapping DES. Placed on DAPT with ASA and ticagrelor at least 12 months. Aggressive risk reduction measures (see below). Diet/lifestyle modification were discussed at length. 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 Planned for staged circumflex PCI. Lesion was felt to be high risk with ulcerated appearance and very tight stenosis - warranted inpatient PCI. Trop peaked at 58.12. He went back to the cath lab and underwent successful PCI of the Lcx with overlapping stents. His home statin was increased to high dose and placed on BB therapy. Hgb A1c was 6.0. Given soft blood pressures, no room to add ACEi/ARB at this time. Consider adding back at follow up appt. Follow  up echo showed EF of 45-50% with hypokinesis in the inferolateral myocardium.    Diontre Sciullo was seen by Dr. Clifton James and determined stable for discharge home. Follow up in the office has been arranged. Medications are listed below.   _____________  Discharge Vitals Blood pressure 117/81, pulse 80, temperature 97.7 F (36.5 C), temperature source Oral, resp. rate 17, height 5\' 11"  (1.803 m), weight 260 lb (117.9 kg), SpO2 96 %.  Filed Weights   03/06/18 0649 03/06/18 1000 03/07/18 0721  Weight: 257 lb 15 oz (117 kg) 264 lb 12.4 oz (120.1 kg) 260 lb (117.9 kg)    Labs & Radiologic Studies    CBC Recent Labs    03/06/18 0246  03/07/18 0233 03/08/18 0244  WBC 16.3*   < > 16.4* 13.8*  NEUTROABS 14.3*  --   --   --   HGB 15.6   < > 13.6 14.2  HCT 45.3   < > 41.7 44.6  MCV 82.7   < > 86.5 86.1  PLT 401*   < > 353 345   < > = values in this interval not displayed.   Basic Metabolic Panel Recent Labs    07/62/26 0244 03/08/18 0915  NA 137 138  K 3.9 4.0  CL 102 101  CO2  21* 28  GLUCOSE 119* 153*  BUN 12 12  CREATININE 1.03 1.07  CALCIUM 8.9 9.4   Liver Function Tests Recent Labs    03/06/18 0246  AST 42*  ALT 48*  ALKPHOS 55  BILITOT 0.8  PROT 8.5*  ALBUMIN 4.9   Recent Labs    03/06/18 0249  LIPASE 27   Cardiac Enzymes Recent Labs    03/06/18 0249  03/06/18 1054 03/06/18 1923 03/07/18 0233  CKTOTAL 383  --   --   --   --   TROPONINI  --    < > 58.12* 49.35* 29.75*   < > = values in this interval not displayed.   BNP Invalid input(s): POCBNP D-Dimer No results for input(s): DDIMER in the last 72 hours. Hemoglobin A1C Recent Labs    03/06/18 1049  HGBA1C 6.0*   Fasting Lipid Panel Recent Labs    03/07/18 0233  CHOL 150  HDL 36*  LDLCALC 70  TRIG 161*  CHOLHDL 4.2   Thyroid Function Tests No results for input(s): TSH, T4TOTAL, T3FREE, THYROIDAB in the last 72 hours.  Invalid input(s): FREET3 _____________  Dg Chest 2  View  Result Date: 03/06/2018 CLINICAL DATA:  Central chest pain since yesterday. Nausea and diaphoresis. EXAM: CHEST - 2 VIEW COMPARISON:  None. FINDINGS: The heart size and mediastinal contours are within normal limits. Both lungs are clear. The visualized skeletal structures are unremarkable. IMPRESSION: No active cardiopulmonary disease. Electronically Signed   By: Burman Nieves M.D.   On: 03/06/2018 03:06   Disposition   Pt is being discharged home today in good condition.  Follow-up Plans & Appointments    Follow-up Information    Rosalio Macadamia, NP Follow up on 03/29/2018.   Specialties:  Nurse Practitioner, Interventional Cardiology, Cardiology, Radiology Why:  at 10am for your follow up appt.  Contact information: [address] [place] [address] [phone]          Discharge Instructions    Amb Referral to Cardiac Rehabilitation   Complete by:  As directed    Diagnosis:   Coronary Stents PTCA STEMI     Diet - low sodium heart healthy   Complete by:  As directed    Discharge instructions   Complete by:  As directed    Radial Site Care Refer to this sheet in the next few weeks. These instructions provide you with information on caring for yourself after your procedure. Your caregiver may also give you more specific instructions. Your treatment has been planned according to current medical practices, but problems sometimes occur. Call your caregiver if you have any problems or questions after your procedure. HOME CARE INSTRUCTIONS You may shower the day after the procedure.Remove the bandage (dressing) and gently wash the site with plain soap and water.Gently pat the site dry.  Do not apply powder or lotion to the site.  Do not submerge the affected site in water for 3 to 5 days.  Inspect the site at least twice daily.  Do not flex or bend the affected arm for 24 hours.  No lifting over 5 pounds (2.3 kg) for 5 days after your procedure.  Do  not drive home if you are discharged the same day of the procedure. Have someone else drive you.  You may drive 24 hours after the procedure unless otherwise instructed by your caregiver.  What to expect: Any bruising will usually fade within 1 to 2 weeks.  Blood that collects  in the tissue (hematoma) may be painful to the touch. It should usually decrease in size and tenderness within 1 to 2 weeks.  SEEK IMMEDIATE MEDICAL CARE IF: You have unusual pain at the radial site.  You have redness, warmth, swelling, or pain at the radial site.  You have drainage (other than a small amount of blood on the dressing).  You have chills.  You have a fever or persistent symptoms for more than 72 hours.  You have a fever and your symptoms suddenly get worse.  Your arm becomes pale, cool, tingly, or numb.  You have heavy bleeding from the site. Hold pressure on the site.   PLEASE DO NOT MISS ANY DOSES OF YOUR BRILINTA!!!!! Also keep a log of you blood pressures and bring back to your follow up appt. Please call the office with any questions.   Patients taking blood thinners should generally stay away from medicines like ibuprofen, Advil, Motrin, naproxen, and Aleve due to risk of stomach bleeding. You may take Tylenol as directed or talk to your primary doctor about alternatives.   Increase activity slowly   Complete by:  As directed        Discharge Medications     Medication List    STOP taking these medications   lisinopril 20 MG tablet Commonly known as:  PRINIVIL,ZESTRIL   lisinopril 40 MG tablet Commonly known as:  PRINIVIL,ZESTRIL     TAKE these medications   ADULT ONE DAILY GUMMIES Chew Chew 2 tablets by mouth daily. Centrum Gummies   aspirin 81 MG EC tablet Take 1 tablet (81 mg total) by mouth daily. Start taking on:  03/09/2018   atorvastatin 80 MG tablet Commonly known as:  LIPITOR Take 1 tablet (80 mg total) by mouth daily at 6 PM. What changed:    medication  strength  how much to take  when to take this   metFORMIN 500 MG 24 hr tablet Commonly known as:  GLUCOPHAGE-XR Take 500 mg by mouth at bedtime.   metoprolol tartrate 25 MG tablet Commonly known as:  LOPRESSOR Take 0.5 tablets (12.5 mg total) by mouth 2 (two) times daily.   nitroGLYCERIN 0.4 MG SL tablet Commonly known as:  NITROSTAT Place 1 tablet (0.4 mg total) under the tongue every 5 (five) minutes as needed.   ticagrelor 90 MG Tabs tablet Commonly known as:  BRILINTA Take 1 tablet (90 mg total) by mouth 2 (two) times daily.        Acute coronary syndrome (MI, NSTEMI, STEMI, etc) this admission?: Yes.     AHA/ACC Clinical Performance & Quality Measures: 5. Aspirin prescribed? - Yes 6. ADP Receptor Inhibitor (Plavix/Clopidogrel, Brilinta/Ticagrelor or Effient/Prasugrel) prescribed (includes medically managed patients)? - Yes 7. Beta Blocker prescribed? - Yes 8. High Intensity Statin (Lipitor 40-80mg  or Crestor 20-40mg ) prescribed? - Yes 9. EF assessed during THIS hospitalization? - Yes 10. For EF <40%, was ACEI/ARB prescribed? - Not Applicable (EF >/= 40%) 11. For EF <40%, Aldosterone Antagonist (Spironolactone or Eplerenone) prescribed? - Not Applicable (EF >/= 40%) 12. Cardiac Rehab Phase II ordered (Included Medically managed Patients)? - Yes    Outstanding Labs/Studies   FLP/LFTs in 6 weeks if tolerating statin.   Duration of Discharge Encounter   Greater than 30 minutes including physician time.  Signed, Laverda Page NP-C 03/08/2018, 12:37 PM

## 2018-03-08 NOTE — Research (Signed)
AEGIS II Informed Consent   Subject Name: Danny Reid  Subject met inclusion and exclusion criteria.  The informed consent form, study requirements and expectations were reviewed with the subject and questions and concerns were addressed prior to the signing of the consent form.  The subject verbalized understanding of the trail requirements.  The subject agreed to participate in the AEGIS II trial and signed the informed consent.  The informed consent was obtained prior to performance of any protocol-specific procedures for the subject.  A copy of the signed informed consent was given to the subject and a copy was placed in the subject's medical record.  Hedrick,Williams Dietrick W 03/08/2018, 1638

## 2018-03-09 ENCOUNTER — Telehealth (HOSPITAL_COMMUNITY): Payer: Self-pay

## 2018-03-09 NOTE — Telephone Encounter (Signed)
Patient contacted regarding discharge from [facility] on March 08, 2018.  Patient understands to follow up with provider Norma Fredrickson on July 30 at 10am at [facility]. Patient understands discharge instructions? Yes Patient understands medications and regiment? Yes, pt did take his Metformin on 7/9; discharge instructions state to begin 7/11. I asked pt to hold 7/10 and 7/11. Patient understands to bring all medications to this visit? Yes

## 2018-03-09 NOTE — Telephone Encounter (Signed)
Patients insurance is active and benefits verified through [facility] - $10.00 co-pay, no deductible, out of pocket amount of $2,500/$168.40 has been met, no co-insurance, and no pre-authorization is required. Passport/reference [phone]  Patients insurance is active and benefits verified through [facility] - No co-pay, deductible amount of $2,250/$0.00 has been met, out of pocket amount of $4,850/$0.00 has been met, 30% co-insurance, and no pre-authorization is required. Passport/reference [phone]  Will contact patient to see if he is interested in the Cardiac Rehab Program. If interested, patient will need to complete follow up appt. Once completed, patient will be contacted for scheduling upon review by the RN Navigator.

## 2018-03-10 ENCOUNTER — Encounter: Payer: Self-pay | Admitting: Nurse Practitioner

## 2018-03-10 NOTE — Research (Addendum)
[facility] visit 1 and 2: Dr. Lia Foyer reviewed inclusion and exclusion before infusion. Dr Lia Foyer did physical exam and reviewed labs before randomization.   Inclusions:   Y N   '[x]'$  '[]'$  Male or male at least 39 years of age  '[x]'$  '[]'$  Evidence of type I (spontaneous) MI as defined by the following:  '[x]'$  '[]'$  a. Detection of a rise and/or fall in Troponin I or T with at least 1 value about the 99% upper reference limit.  '[]'$  '[]'$   (AND)---  Any 1 or more of the following:   '[x]'$  '[]'$  - symptoms of ischemia (ie, resulting from a primary coronary    artery event)  '[]'$  '[]'$  - New or presumably new significant ST/T wave changes or left bundle branch block.  '[]'$  '[]'$       - Development of pathological Q waves on EKG  '[]'$  '[]'$  - Imaging evidence of new loss or viable myocardium or regional wall motion abnormality.  '[]'$  '[]'$  - ID of intracoronary thrombus by angiography.  '[x]'$  '[]'$  No suspicion of acute kidney injury at least 12 hours after angiography OR after first medical contract for subject's not undergoing angiography There must be documented evidence of stable renal function defined as no more than an increase in Serum Creatinine < 0.'3mg'$ /dl from pre-contrast serum creatinine value.   (Before  _1.08_   12 hrs after 1.07_)  '[x]'$  '[]'$  Evidence of multi-vessel coronary artery disease defined as:  '[x]'$  '[]'$  A. At least 50% stenosis on >1 epicardial artery or left main artery on catherization performed during the index hospitalization.  '[]'$  '[]'$  B. Prior cardiac catherization with at least 50% stenosis on >1 epicardial artery or left main artery.  '[]'$  '[]'$  C. Prior PCI and evidence of at least 50% stenosis of at least 1 epicardial artery different from prior revascularized artery.  '[]'$  '[]'$  D. Prior multivessel coronary artery bypass grafting.  '[x]'$  '[]'$  At least 1 of the following established risk factors:  '[]'$  '[]'$  o Age ? 65 years  '[]'$  '[]'$  o Prior history of MI  '[x]'$  '[]'$  o On pharmacological treatment for diabetes mellitus  '[]'$  '[]'$  o Peripheral  arterial disease defined as meeting at least 1 of the following criteria:  '[]'$  '[]'$          +   Current intermittent claudication or resting limb ischemia and ABI    ?0.90  '[]'$  '[]'$          +   History of peripheral revascularization (surgical or percutaneous)  '[]'$  '[]'$          +  History of limb amputation due to PAD  '[]'$  '[]'$          +  Angiographic evidence (using computed tomographic angiography, MRA, or invasive angiography or a peripheral artery stenosis ?50%.  '[]'$  '[]'$  If the male subject without child bearing potential, not breastfeeding, not pregnant, and if of child bearing potential agree to contraception or lifestyle methods to avoid pregnancy? Child-bearing potential (must select all)   ____ not pregnant (by urine or serum hCG AND   ____ willing to use an acceptable method of contraception to avoid pregnancy during the study and for 3 months after last dose of investional product (refer to acceptable methods per protocol)   ____ if breastfeeding, willing to cease breastfeeding Date of pregnancy test: (____ /_____/ ________)  Result  - or + Not of Child bearing potential (select one)   ____ Age >= 37   ____ Age 71-60 with amenorrhea for  at least 1 year with documented evidence of follicle-stimulating hormone level >40 IU/L   ____ Surgically sterile for at least 3 months prior to randomization  '[x]'$  '[]'$  Investigator believes that the subject is willing and able to adhere to all protocol requirements.   '[x]'$  '[]'$  Willing to not participate in another investigational study until completion of their final study visit.     Exclusions:  Y N   '[]'$  '[x]'$  If these are the reason for MI (pt is excluded)  '[]'$  '[x]'$  1. Myocardial necrosis due mismatch between myocardial oxygen demand and supply, usually due to fixed coronary disease with increased demand leading to MI  '[]'$  '[x]'$  2. Cardiac death due to MI  '[]'$  '[x]'$  3. Myocardial necrosis due to complications from a PCI  '[]'$  '[x]'$  4. Myocardial necrosis due to stent  thrombosis  '[]'$  '[x]'$  5. Myocardial necrosis due to in stent restenosis as the only etiology  '[]'$  '[x]'$  6. Myocardial necrosis in the stenting of coronary artery bypass grafting  '[]'$  '[x]'$  Ongoing hemodynamic instability  '[]'$  '[x]'$       +  History of NYHA Class III or IV heart failure within the last year  '[]'$  '[x]'$       +  Killip Class III or IV heart failure  '[]'$  '[x]'$       +  Sustained and/or symptomatic hypotension (SBP <90 mm HG)  '[]'$  '[x]'$       +  Known left ventricular ejection fraction of <30%  '[]'$  '[x]'$  Evidence of hepatobiliary disease as indicated by any 1 or more of the   following at screening:  '[]'$  '[x]'$       +  Current active hepatic dysfunction or active biliary obstruction  '[]'$  '[x]'$       +       +  Chronic or prior history of cirrhosis or of infectious / inflammatory hepatitis NOTE: If a patient has a medical history of recovered Hep A, B, or C without evidence of cirrhosis, he/she could be considered for inclusion if there is documented evidence that there is no active infection (ie, antigen negative)  '[]'$  '[x]'$       + Hepatic lab abnormalities: ALT > 3 x ULN or Total bilirubin > 2x ULN at randomization.  '[]'$  '[x]'$  Severe chronic kidney disease (eGFR of <10m) or on dialysis  '[]'$  '[x]'$  Plan to undergo scheduled coronary artery bypass graft surgery after randomization, as determined at the time of screening  '[]'$  '[x]'$  Known history of allergies to soybeans, peanuts, albumin  '[]'$  '[x]'$  Body weight <50 kg  '[]'$  '[x]'$  A known history of IgA deficiency or antibodies to IgA  '[]'$  '[x]'$  A comorbid condition with an estimated life expectancy of ? 6 months at time of consent  '[]'$  '[x]'$  Women who are pregnant or breastfeeding at time of randomization  '[]'$  '[x]'$  Participated in another interventional clinical study at the time of consent  '[]'$  '[x]'$  Treatment with anticancer therapy  '[]'$  '[x]'$  Previously randomized or participated in this study or previously exposed to [place]   Ref Range & Units 5d ago 03/06/18 @ 0246  Sodium  135 - 145 mmol/L 139   Potassium 3.5 - 5.1 mmol/L 4.3   Chloride 98 - 111 mmol/L 100   Comment: Please note change in reference range.  CO2 22 - 32 mmol/L 29   Glucose, Bld 70 - 99 mg/dL 171High    Comment: Please note change in reference range.  BUN 6 - 20 mg/dL 11  Comment: Please note change in reference range.  Creatinine, Ser 0.61 - 1.24 mg/dL 1.08   Calcium 8.9 - 10.3 mg/dL 9.9   Total Protein 6.5 - 8.1 g/dL 8.5High    Albumin 3.5 - 5.0 g/dL 4.9   AST 15 - 41 U/L 42High    ALT 0 - 44 U/L 48High    Comment: Please note change in reference range.  Alkaline Phosphatase 38 - 126 U/L 55   Total Bilirubin 0.3 - 1.2 mg/dL 0.8   GFR calc non Af Amer >60 mL/min >60   GFR calc Af Amer >60 mL/min >60    Post Contrast Labs:   Ref Range & Units 3d ago 03/08/18 @ 0915  Sodium 135 - 145 mmol/L 138   Potassium 3.5 - 5.1 mmol/L 4.0   Chloride 98 - 111 mmol/L 101   Comment: Please note change in reference range.  CO2 22 - 32 mmol/L 28   Glucose, Bld 70 - 99 mg/dL 153High    Comment: Please note change in reference range.  BUN 6 - 20 mg/dL 12   Comment: Please note change in reference range.  Creatinine, Ser 0.61 - 1.24 mg/dL 1.07   Calcium 8.9 - 10.3 mg/dL 9.4   GFR calc non Af Amer >60 mL/min >60   GFR calc Af Amer >60 mL/min >60

## 2018-03-11 ENCOUNTER — Telehealth: Payer: Self-pay

## 2018-03-11 ENCOUNTER — Telehealth (HOSPITAL_COMMUNITY): Payer: Self-pay

## 2018-03-11 ENCOUNTER — Telehealth: Payer: Self-pay | Admitting: Cardiovascular Disease

## 2018-03-11 NOTE — Telephone Encounter (Signed)
New Message    Per patient he needs another note for work , stating what his restrictions are. Please call

## 2018-03-11 NOTE — Telephone Encounter (Signed)
Contacted patient to see if he is interested in the Cardiac Rehab program. Patient stated he is interested. Explained scheduling process and went over insurance, patient verbalized understanding. Will contact patient for scheduling once follow up appt has been completed upon review by the RN Navigator.

## 2018-03-11 NOTE — Research (Addendum)
[facility] Visit 1 and 2  Pt doing well. No complaints of cp or sob. Infusion went well. Will see patient back on Monday 03/14/18 for second infusion.                                   "CONSENT"   YES     NO   Continuing further Investigational Product and study visits for follow-up? [x]  []   Continuing consent from future biomedical research [x]  []                                     "EVENTS"    YES     NO  AE   (IF YES SEE SOURCE) []  [x]   SAE  (IF YES SEE SOURCE) []  [x]   ENDPOINT   (IF YES SEE SOURCE) []  [x]   REVASCULARIZATION  (IF YES SEE SOURCE) []  [x]   AMPUTATION   (IF YES SEE SOURCE) []  [x]   TROPONIN'S  (IF YES SEE SOURCE) []  [x]    DEMOGRAPHICS:  Patient Name: Danny Reid Birth Date: 10/10/1981  Sex: Male  Race: AA  Child Bearing: ? Yes    ? No ? Tubial ligation ? Hysterectomy  ? postmenopausal   Height: 178 cm Weight: 118 kg   Index Procedure:  Onset date of symptoms: 6-Jul-19 Onset of symptoms: 2000  Date of First contact at hospital: 7-Jul-19 Time of first contact at hospital: 0240  Admission Date: 7-Jul-19   Discharge Date: 9-Jul-19 Discharge Time: Click or tap to enter a date.   Vital Signs: Date 03/06/2018    Time: 0331 BP: 124/83  Pulse: 59    Concomitant medications: Every visit: ? See med sheet  BMP Pre Contrast IV 03/06/18 @ 0246 1.08  CMP Post Contrast IV 12 hours later: 03/08/18 @  Hepatic Panel: 03/06/18 ALT: 48 Total Bili: 0.8 Direct Bili: Click or tap here to enter text.   Medical History:  ? CAD ? Prior MI ? PAD  ? History of Heart Failure ? Moderate to severe valvular dx ? AFib  ? Prior Coronary Revascularization  if YES please select Yes or No below:  CABG ? Yes   ? No           PCI with stent ? Yes   ? No          PCI without stent ? Yes ? No  ? CVA if checked please select one of the following Choose an item.  ? Hypertension  ? Gilberts syndrome ? CKD   ? Hypocholesteremia ? DM ? Smoker        ? eCigarette  Killip Class Stage 1     EQ-5D-3L  ?  Future Biomedical Research: Consented ? Yes     ? No If no please date they withdrew consent from biomedical research Click or tap to enter a date.  Central Labs Before Start of Infusion: ? Biochemistry panel      ? Hematology     ? Immunogenicity   (30 mins before infusion)   ? Parvovirus  ? FBR sample ? PK/PD sample Central Labs End of Infusion:   ? PK/PD Central Blood Draw Time: Before SOI: 03/08/18 @ 1128 _ After EOI: 03/08/18 @ 1328___ Infusion Start Time: 03/08/2018 11:30 AM  Infusion End Time: 03/08/2018 1:25 PM  VS before infusion: 03/08/18 BP: 117/81  HR:  80   Central Labs:      EQ-5D-5L  MOBILITY:    I HAVE NO PROBLEMS WALKING [x]   I HAVE SLIGHT PROBLEMS WALKING []   I HAVE MODERATE PROBLEMS WALKING []   I HAVE SEVERE PROBLEMS WALKING []   I AM UNABLE TO WALK  []     SELF-CARE:   I HAVE NO PROBLEMS WASING OR DRESSING MYSELF  [x]   I HAVE SLIGHT PROBLEMS WASHING OR DRESSING MYSELF  []   I HAVE MODERATE PROBLEMS WASHING OR DRESSING MYSELF []   I HAVE SEVERE PROBLEMS WASHING OR DRESSING MYSELF  []   I HAVE SEVERE PROBLEMS WASHING OR DRESSING MYSELF  []   I AM UNABLE TO WASH OR DRESS MYSELF []     USUAL ACTIVITIES: (E.G. WORK/STUDY/HOUSEWORK/FAMILY OR LEISURE ACTIVITIES.    I HAVE NO PROBLEMS DOING MY USUAL ACTIVITIES [x]   I HAVE SLIGHT PROBLEMS DOING MY USUAL ACTIVITIES []   I HAVE MODERATE PROBLEMS DOING MY USUAL ACTIVIITIES []   I HAVE SEVERE PROBLEMS DOING MY USUAL ACTIVITIES []   I AM UNABLE TO DO MY USUAL ACTIVITIES []     PAIN /DISCOMFORT   I HAVE NO PAIN OR DISCOMFORT [x]   I HAVE SLIGHT PAIN OR DISCOMFORT []   I HAVE MODERATE PAIN OR DISCOMFORT []   I HAVE SEVERE PAIN OR DISCOMFORT []   I HAVE EXTREME PAIN OR DISCOMFORT []     ANXIETY/DEPRESSION   I AM NOT ANXIOUS OR DEPRESSED [x]   I AM SLIGHTLY ANXIOUS OR DEPRESSED []   I AM MODERATELY ANXIOUS OR DREPRESSED []   I AM SEVERELY ANXIOUS OR DEPRESSED []   I AM EXTREMELY ANXIOUS OR DEPRESSED []     SCALE OF 0-100 HOW  WOULD YOU RATE TODAY?  0 IS THE WORSE AND 100 IS THE BEST HEALTH YOU CAN IMAGINE: 85

## 2018-03-11 NOTE — Telephone Encounter (Signed)
Patient walked in to office needing a work note for his part-time job with [employer]. Patient stated he needs a note stated he can do light duty until his appointment with Dr. Excell Seltzer on 03/29/18. Patient needs this note before 03/16/18. Will send to Laverda Page, NP, since she wrote the original letter to return back to work. Patient was in agreement with plan and can retrieve note on MyChart when done.

## 2018-03-14 ENCOUNTER — Encounter: Payer: 59 | Admitting: *Deleted

## 2018-03-14 ENCOUNTER — Encounter: Payer: Self-pay | Admitting: Cardiology

## 2018-03-14 ENCOUNTER — Encounter (HOSPITAL_COMMUNITY)
Admit: 2018-03-14 | Discharge: 2018-03-14 | Disposition: A | Discharge status: Home / Self Care | Payer: 59 | Attending: Internal Medicine | Admitting: Internal Medicine

## 2018-03-14 VITALS — BP 136/88 | HR 73

## 2018-03-14 DIAGNOSIS — Z006 Encounter for examination for normal comparison and control in clinical research program: Secondary | ICD-10-CM

## 2018-03-14 LAB — BASIC METABOLIC PANEL
ANION GAP: 11 (ref 5–15)
BUN: 12 mg/dL (ref 6–20)
CHLORIDE: 102 mmol/L (ref 98–111)
CO2: 27 mmol/L (ref 22–32)
Calcium: 9.8 mg/dL (ref 8.9–10.3)
Creatinine, Ser: 1.12 mg/dL (ref 0.61–1.24)
Glucose, Bld: 106 mg/dL — ABNORMAL HIGH (ref 70–99)
POTASSIUM: 4.3 mmol/L (ref 3.5–5.1)
Sodium: 140 mmol/L (ref 135–145)

## 2018-03-14 MED ORDER — STUDY - AEGIS II STUDY - PLACEBO OR CSL112 (PI-HILTY)
170.0000 mL | INTRAVENOUS | Status: DC
  Administered 2018-03-14: 170 mL via INTRAVENOUS
  Filled 2018-03-14: qty 170

## 2018-03-14 NOTE — Progress Notes (Signed)
[facility] visit 3 infusion 2  Pt doing well. No complaints of cp or sob. Local labs drawn and reviewed by Dr Rennis Golden before randomization.  Taking meds are directed.                                    "CONSENT"   YES     NO   Continuing further Investigational Product and study visits for follow-up? [x]  []   Continuing consent from future biomedical research [x]  []                                     "EVENTS"    YES     NO  AE   (IF YES SEE SOURCE) []  [x]   SAE  (IF YES SEE SOURCE) []  [x]   ENDPOINT   (IF YES SEE SOURCE) []  [x]   REVASCULARIZATION  (IF YES SEE SOURCE) []  [x]   AMPUTATION   (IF YES SEE SOURCE) []  [x]   TROPONIN'S  (IF YES SEE SOURCE) []  [x]     Current Outpatient Medications:  .  aspirin EC 81 MG EC tablet, Take 1 tablet (81 mg total) by mouth daily., Disp: , Rfl:  .  atorvastatin (LIPITOR) 80 MG tablet, Take 1 tablet (80 mg total) by mouth daily at 6 PM., Disp: 90 tablet, Rfl: 2 .  metFORMIN (GLUCOPHAGE-XR) 500 MG 24 hr tablet, Take 500 mg by mouth at bedtime., Disp: , Rfl: 1 .  metoprolol tartrate (LOPRESSOR) 25 MG tablet, Take 0.5 tablets (12.5 mg total) by mouth 2 (two) times daily., Disp: 60 tablet, Rfl: 2 .  Multiple Vitamins-Minerals (ADULT ONE DAILY GUMMIES) CHEW, Chew 2 tablets by mouth daily. Centrum Gummies, Disp: , Rfl:  .  nitroGLYCERIN (NITROSTAT) 0.4 MG SL tablet, Place 1 tablet (0.4 mg total) under the tongue every 5 (five) minutes as needed., Disp: 25 tablet, Rfl: 2 .  ticagrelor (BRILINTA) 90 MG TABS tablet, Take 1 tablet (90 mg total) by mouth 2 (two) times daily., Disp: 180 tablet, Rfl: 2 No current facility-administered medications for this visit.   Facility-Administered Medications Ordered in Other Visits:  .  [facility] II Study - Placebo / CSL112 (PI-[place]ilty), 170 mL, Intravenous, Q7 days, Chrystie Nose, MD, Last Rate: 85 mL/hr at 03/14/18 1033, 170 mL at 03/14/18 1033

## 2018-03-16 ENCOUNTER — Ambulatory Visit (HOSPITAL_COMMUNITY)
Admission: RE | Admit: 2018-03-16 | Discharge: 2018-03-16 | Disposition: A | Discharge status: Home / Self Care | Payer: 59 | Source: Ambulatory Visit | Attending: Internal Medicine | Admitting: Internal Medicine

## 2018-03-16 ENCOUNTER — Telehealth: Payer: Self-pay

## 2018-03-16 ENCOUNTER — Encounter: Payer: Self-pay | Admitting: Cardiovascular Disease

## 2018-03-16 DIAGNOSIS — M25421 Effusion, right elbow: Secondary | ICD-10-CM

## 2018-03-16 NOTE — Telephone Encounter (Signed)
Pt presents today as a walk in. He has c/o  pain, swelling and bruising of his right forearm after a heart cath. Pt states he is uncomfortable with sleeping on his right side and states his arm is "throbbing." Upon assessment, he has brisk capillary refill of all 5 digits (<3 sec), some non pitting edema, and bruising of his mid forearm. His radial pulse is +3, ulnar and brachial are +2. I noted no erythemia and skin temp is WNL.   Brought this to the attention of Dr Ladona Ridgel, DOD, he has given orders for an outpatient upper extremity duplex for additional assessment. Dr Ladona Ridgel advises if his arm becomes more painful, pt should seek advisement from the ED. Pt agrees to plan and has no additional questions.

## 2018-03-17 NOTE — Telephone Encounter (Signed)
Pt aware of results and recommendations. Pt did inquire about a handicapped tag. I advised pt to download the form and bring it to the office filled out. Dr Excell Seltzer would review it at that time. I will forward encounter to Dr Earmon Phoenix RN so she is aware.

## 2018-03-17 NOTE — Research (Signed)
PK collection only

## 2018-03-17 NOTE — Telephone Encounter (Signed)
thx - [place] result normal. Would continue with arm elevation, ice as needed. Likely will take some time for hematoma to resolve.

## 2018-03-17 NOTE — Progress Notes (Signed)
[facility] labs for visit 3

## 2018-03-21 ENCOUNTER — Ambulatory Visit (HOSPITAL_COMMUNITY)
Admit: 2018-03-21 | Discharge: 2018-03-21 | Disposition: A | Discharge status: Home / Self Care | Payer: 59 | Source: Ambulatory Visit | Attending: Internal Medicine | Admitting: Internal Medicine

## 2018-03-21 ENCOUNTER — Encounter: Payer: 59 | Admitting: *Deleted

## 2018-03-21 ENCOUNTER — Other Ambulatory Visit: Payer: Self-pay

## 2018-03-21 ENCOUNTER — Emergency Department (HOSPITAL_COMMUNITY)
Admission: EM | Admit: 2018-03-21 | Discharge: 2018-03-21 | Disposition: A | Discharge status: Home / Self Care | Payer: 59 | Attending: Emergency Medicine | Admitting: Emergency Medicine

## 2018-03-21 ENCOUNTER — Emergency Department (HOSPITAL_COMMUNITY): Payer: 59

## 2018-03-21 ENCOUNTER — Encounter (HOSPITAL_COMMUNITY): Payer: Self-pay | Admitting: *Deleted

## 2018-03-21 VITALS — BP 118/84 | HR 67

## 2018-03-21 DIAGNOSIS — Z79899 Other long term (current) drug therapy: Secondary | ICD-10-CM

## 2018-03-21 DIAGNOSIS — I10 Essential (primary) hypertension: Secondary | ICD-10-CM | POA: Diagnosis not present

## 2018-03-21 DIAGNOSIS — Z7984 Long term (current) use of oral hypoglycemic drugs: Secondary | ICD-10-CM | POA: Insufficient documentation

## 2018-03-21 DIAGNOSIS — I252 Old myocardial infarction: Secondary | ICD-10-CM | POA: Insufficient documentation

## 2018-03-21 DIAGNOSIS — E119 Type 2 diabetes mellitus without complications: Secondary | ICD-10-CM | POA: Insufficient documentation

## 2018-03-21 DIAGNOSIS — Z8249 Family history of ischemic heart disease and other diseases of the circulatory system: Secondary | ICD-10-CM | POA: Insufficient documentation

## 2018-03-21 DIAGNOSIS — R0789 Other chest pain: Secondary | ICD-10-CM

## 2018-03-21 DIAGNOSIS — Z7982 Long term (current) use of aspirin: Secondary | ICD-10-CM

## 2018-03-21 DIAGNOSIS — Z87891 Personal history of nicotine dependence: Secondary | ICD-10-CM | POA: Insufficient documentation

## 2018-03-21 DIAGNOSIS — Z7902 Long term (current) use of antithrombotics/antiplatelets: Secondary | ICD-10-CM

## 2018-03-21 DIAGNOSIS — Z955 Presence of coronary angioplasty implant and graft: Secondary | ICD-10-CM

## 2018-03-21 DIAGNOSIS — R7303 Prediabetes: Secondary | ICD-10-CM | POA: Insufficient documentation

## 2018-03-21 DIAGNOSIS — Z006 Encounter for examination for normal comparison and control in clinical research program: Secondary | ICD-10-CM

## 2018-03-21 DIAGNOSIS — E785 Hyperlipidemia, unspecified: Secondary | ICD-10-CM | POA: Insufficient documentation

## 2018-03-21 HISTORY — DX: Acute myocardial infarction, unspecified: I21.9

## 2018-03-21 LAB — CBC WITH DIFFERENTIAL/PLATELET
Abs Immature Granulocytes: 0 10*3/uL (ref 0.0–0.1)
BASOS ABS: 0.1 10*3/uL (ref 0.0–0.1)
BASOS PCT: 1 %
EOS PCT: 5 %
Eosinophils Absolute: 0.4 10*3/uL (ref 0.0–0.7)
HCT: 44.2 % (ref 39.0–52.0)
Hemoglobin: 13.8 g/dL (ref 13.0–17.0)
Immature Granulocytes: 0 %
Lymphocytes Relative: 23 %
Lymphs Abs: 2.1 10*3/uL (ref 0.7–4.0)
MCH: 27.6 pg (ref 26.0–34.0)
MCHC: 31.2 g/dL (ref 30.0–36.0)
MCV: 88.4 fL (ref 78.0–100.0)
MONO ABS: 0.5 10*3/uL (ref 0.1–1.0)
Monocytes Relative: 6 %
Neutro Abs: 6 10*3/uL (ref 1.7–7.7)
Neutrophils Relative %: 65 %
PLATELETS: 466 10*3/uL — AB (ref 150–400)
RBC: 5 MIL/uL (ref 4.22–5.81)
RDW: 12 % (ref 11.5–15.5)
WBC: 9.1 10*3/uL (ref 4.0–10.5)

## 2018-03-21 LAB — COMPREHENSIVE METABOLIC PANEL
ALT: 24 U/L (ref 0–44)
ANION GAP: 10 (ref 5–15)
AST: 18 U/L (ref 15–41)
Albumin: 4.1 g/dL (ref 3.5–5.0)
Alkaline Phosphatase: 53 U/L (ref 38–126)
BUN: 9 mg/dL (ref 6–20)
CALCIUM: 9.5 mg/dL (ref 8.9–10.3)
CHLORIDE: 105 mmol/L (ref 98–111)
CO2: 26 mmol/L (ref 22–32)
Creatinine, Ser: 0.94 mg/dL (ref 0.61–1.24)
GFR calc non Af Amer: >60 mL/min (ref >60)
Glucose, Bld: 124 mg/dL — ABNORMAL HIGH (ref 70–99)
POTASSIUM: 4.4 mmol/L (ref 3.5–5.1)
SODIUM: 141 mmol/L (ref 135–145)
Total Bilirubin: 0.9 mg/dL (ref 0.3–1.2)
Total Protein: 7.6 g/dL (ref 6.5–8.1)

## 2018-03-21 LAB — LIPASE, BLOOD: Lipase: 38 U/L (ref 11–51)

## 2018-03-21 LAB — TROPONIN I: Troponin I: 0.03 ng/mL (ref <0.03)

## 2018-03-21 LAB — GLUCOSE, CAPILLARY: GLUCOSE-CAPILLARY: 168 mg/dL — AB (ref 70–99)

## 2018-03-21 MED ORDER — STUDY - AEGIS II STUDY - PLACEBO OR CSL112 (PI-HILTY)
170.0000 mL | INTRAVENOUS | Status: DC
  Filled 2018-03-21: qty 170

## 2018-03-21 MED ORDER — MORPHINE SULFATE (PF) 4 MG/ML IV SOLN
4.0000 mg | Freq: Once | INTRAVENOUS | Status: DC
  Filled 2018-03-21: qty 1

## 2018-03-21 MED ORDER — ASPIRIN 81 MG PO CHEW
243.0000 mg | CHEWABLE_TABLET | Freq: Once | ORAL | Status: AC
  Administered 2018-03-21: 243 mg via ORAL
  Filled 2018-03-21: qty 3

## 2018-03-21 NOTE — Consult Note (Addendum)
Cardiology Consult    Patient ID: Danny Reid; MRN: [id number]; DOB: [birth year]   Admission date: 03/21/2018  Primary Care Provider: Dorthula Matas, PA-C Primary Cardiologist: Tonny Bollman, MD  Primary Electrophysiologist:  NA  Chief Complaint:  Chest pain   Danny Reid is a 37 y.o. male who is being seen today for the evaluation of chest pain at the request of Dr. Erma Heritage.  Patient Profile:   Danny Reid is a 37 y.o. male with a history of CAD, DM HLD, HTN and recent admit for acute MI cardiac cath with stenting of the RCA/PDA using overlapping DES.  Also had PCI of LCX.   Discharged 03/08/18 on brilinta and ASA, statin and BB.  EF 45-50%.      History of Present Illness:   Danny Reid with recent hx of acute MI but did not meet STE pattern.  He did have inf. Lat changes, NSTEMI, on arrival to [facility] from [facility], he developed 10/10 pain with diaphoresis, nausea and vomiting, with pain, to cath lab,  underwent stenting of the RCA/PDA using overlapping DES.  Also with LCX dz so had PCI  Also had discharged 03/08/18 on brilinta and ASA, statin.  Today presented for chest pain.  He told research staff for aegis infusion that he woke today with chest discomfort and diaphoretic this AM.  Pain was 3/10  EKG I personally reviewed was without changes and continued deep T wave inversions II,III, AVF and lateral.       No pain now, worried he had a heart attack.  Reassured.  His troponin is 0.33 but on last check 58 - believe just trending down.   This would have been his third infusion of aegis.  Has not had any problems walking on treadmill for 30 min at 3 MPH.    Past Medical History:  Diagnosis Date  . Chickenpox   . Diabetes mellitus without complication (HCC)    borderline  . High cholesterol   . Hypertension     Past Surgical History:  Procedure Laterality Date  . CORONARY STENT INTERVENTION N/A 03/06/2018   Procedure: CORONARY STENT INTERVENTION;  Surgeon: Tonny Bollman, MD;  Location: [facility] INVASIVE CV LAB;  Service: Cardiovascular;  Laterality: N/A;  . CORONARY STENT INTERVENTION N/A 03/07/2018   Procedure: CORONARY STENT INTERVENTION;  Surgeon: Yvonne Kendall, MD;  Location: [facility] INVASIVE CV LAB;  Service: Cardiovascular;  Laterality: N/A;  CFX X 2 STENTS  . CORONARY/GRAFT ACUTE MI REVASCULARIZATION N/A 03/06/2018   Procedure: Coronary/Graft Acute MI Revascularization;  Surgeon: Tonny Bollman, MD;  Location: [facility] INVASIVE CV LAB;  Service: Cardiovascular;  Laterality: N/A;  . LEFT HEART CATH AND CORONARY ANGIOGRAPHY N/A 03/06/2018   Procedure: LEFT HEART CATH AND CORONARY ANGIOGRAPHY;  Surgeon: Tonny Bollman, MD;  Location: [facility] INVASIVE CV LAB;  Service: Cardiovascular;  Laterality: N/A;     Medications Prior to Admission: Prior to Admission medications   Medication Sig Start Date End Date Taking? Authorizing Provider  aspirin EC 81 MG EC tablet Take 1 tablet (81 mg total) by mouth daily. 03/09/18   Arty Baumgartner, NP  atorvastatin (LIPITOR) 80 MG tablet Take 1 tablet (80 mg total) by mouth daily at 6 PM. 03/08/18   Arty Baumgartner, NP  metFORMIN (GLUCOPHAGE-XR) 500 MG 24 hr tablet Take 500 mg by mouth at bedtime. 12/29/17   [provider]  metoprolol tartrate (LOPRESSOR) 25 MG tablet Take 0.5 tablets (12.5 mg total) by mouth 2 (two) times daily.  03/08/18   Arty Baumgartner, NP  Multiple Vitamins-Minerals (ADULT ONE DAILY GUMMIES) CHEW Chew 2 tablets by mouth daily. Centrum Gummies    [provider]  nitroGLYCERIN (NITROSTAT) 0.4 MG SL tablet Place 1 tablet (0.4 mg total) under the tongue every 5 (five) minutes as needed. 03/08/18   Arty Baumgartner, NP  ticagrelor (BRILINTA) 90 MG TABS tablet Take 1 tablet (90 mg total) by mouth 2 (two) times daily. 03/08/18   Arty Baumgartner, NP     Allergies:   No Known Allergies  Social History:   Social History   Socioeconomic History  . Marital status: Single    Spouse name: Not on file    . Number of children: Not on file  . Years of education: Not on file  . Highest education level: Not on file  Occupational History  . Not on file  Social Needs  . Financial resource strain: Not hard at all  . Food insecurity:    Worry: Never true    Inability: Never true  . Transportation needs:    Medical: No    Non-medical: No  Tobacco Use  . Smoking status: Current Every Day Smoker    Packs/day: 0.50    Years: 13.00    Pack years: 6.50    Types: Cigars  . Smokeless tobacco: Never Used  Substance and Sexual Activity  . Alcohol use: Yes    Comment: social  . Drug use: No  . Sexual activity: Not on file  Lifestyle  . Physical activity:    Days per week: 3 days    Minutes per session: 20 min  . Stress: Only a little  Relationships  . Social connections:    Talks on phone: More than three times a week    Gets together: More than three times a week    Attends religious service: Never    Active member of club or organization: Yes    Attends meetings of clubs or organizations: 1 to 4 times per year    Relationship status: Never married  . Intimate partner violence:    Fear of current or ex partner: No    Emotionally abused: No    Physically abused: No    Forced sexual activity: No  Other Topics Concern  . Not on file  Social History Narrative  . Not on file    Family History:   The patient's family history includes Heart attack in his paternal grandfather; Hypertension in his mother; Multiple sclerosis in his paternal aunt.    ROS:  Please see the history of present illness.  General:no colds or fevers, no weight changes Skin:no rashes or ulcers HEENT:no blurred vision, no congestion CV:see HPI PUL:see HPI GI:no diarrhea constipation or melena, no indigestion GU:no hematuria, no dysuria MS:no joint pain, no claudication Neuro:no syncope, no lightheadedness Endo:+ diabetes, no thyroid disease Has not missed any meds  Physical Exam/Data:   Vitals:    03/21/18 1029  BP: (!) 151/84  Pulse: 62  Resp: 16  Temp: 98.6 F (37 C)  TempSrc: Oral  SpO2: 100%   No intake or output data in the 24 hours ending 03/21/18 1248 There were no vitals filed for this visit. There is no height or weight on file to calculate BMI.  General:  Well nourished, well developed, in no acute distress, though anxious HEENT: normal Lymph: no adenopathy Neck: no JVD Endocrine:  No thryomegaly Vascular: No carotid bruits; pedal pulses 2+ bilaterally  Cardiac:  normal S1, S2; RRR; no murmur, gallup rub or click  Lungs:  clear to auscultation bilaterally, no wheezing, rhonchi or rales  Abd: soft, nontender, no hepatomegaly  Ext: no lower ext edema Musculoskeletal:  No deformities, BUE and BLE strength normal and equal Skin: warm and dry  Neuro:  CNs 2-12 intact, no focal abnormalities noted Psych:  Normal affect    Relevant CV Studies: Echo 03/07/18 Study Conclusions  - Left ventricle: The cavity size was normal. Wall thickness was   increased in a pattern of mild LVH. Systolic function was mildly   reduced. The estimated ejection fraction was in the range of 45%   to 50%. There is hypokinesis of the inferolateral myocardium. The   study is not technically sufficient to allow evaluation of LV   diastolic function. - Aortic valve: There was trivial regurgitation. - Mitral valve: There was mild regurgitation.  Impressions:  - Hypokinesis of the inferolateral wall with overall mild LV   dysfunction; mild LVH; trace AI; mild MR.   Cardiac cath 03/06/18  Dist RCA lesion is 100% stenosed.  Ost RPDA to RPDA lesion is 75% stenosed.  There is moderate left ventricular systolic dysfunction.  LV end diastolic pressure is moderately elevated.  The left ventricular ejection fraction is 35-45% by visual estimate.  Ost Cx to Prox Cx lesion is 40% stenosed.  Prox Cx to Mid Cx lesion is 90% stenosed.  Ramus lesion is 50% stenosed.  A drug-eluting  stent was successfully placed using a STENT RESOLUTE ONYX 3.0X18.  Post intervention, there is a 0% residual stenosis.  Post intervention, there is a 0% residual stenosis.  A drug-eluting stent was successfully placed using a STENT RESOLUTE ONYX 4.0X26.   1.  Acute total occlusion of the distal RCA with heavy thrombus, with severe stenosis extending into the distal RCA bifurcation of the PDA and PLA branches 2.  Severe stenosis of the mid left circumflex extending into the second obtuse marginal branch 3.  Mild nonobstructive disease of the left main, LAD, and ramus intermedius 4.  Moderate segmental LV dysfunction with akinesis of the entire inferior wall, LVEF estimated at approximately 40% 5.  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  Successful PCI of the distal RCA and ostial PDA with overlapping drug-eluting stents  Recommendations: Continue Aggrastat x12 hours.  The patient is loaded with ticagrelor 180 mg in the cardiac Cath Lab.  Aggressive secondary risk reduction measures.  Recommend uninterrupted dual antiplatelet therapy with Aspirin 81mg  daily and Ticagrelor 90mg  twice daily for a minimum of 12 months (ACS - Class I recommendation).  PCI 03/07/18 Conclusions: 1. Multivessel CAD, unchanged from the conclusion of yesterday's LHC/PCI, including severe mid/distal LCx disease. 2. Widely patent distal RCA/rPDA stents with improved flow in the distal branches following tirofiban therapy. 3. Moderately elevated left ventricular filling pressure (LVEDP ~25 mmHg). 4. Successful PCI to proximal/mid LCx extending into ostium of OM3 using overlapping Resolute Onyx 2.5 x 12 mm (proximal) and Resolute Onyx 2.25 x 15 mm (distal) drug eluting stents with 0% residual stenosis and TIMI-3 flow.  Recommendations: 1. Aggressive secondary prevention. 2. Administer furosemide 20 mg IV x 1 given moderately elevated LVEDP in the  setting of moderately reduced LVEF. 3. Optimize evidence-based heart failure therapy, as heart rate, blood pressure, and renal function allow. 4. Likely discharge home tomorrow if stable overnight.  Recommend uninterrupted dual antiplatelet therapy with Aspirin 81mg  daily and Ticagrelor 90mg  twice daily  for a minimum of 12 months (ACS - Class I recommendation).   Post-Intervention Diagram       Implants     Laboratory Data:  Chemistry Recent Labs  Lab 03/21/18 1059  NA 141  K 4.4  CL 105  CO2 26  GLUCOSE 124*  BUN 9  CREATININE 0.94  CALCIUM 9.5  GFRNONAA >60  GFRAA >60  ANIONGAP 10    Recent Labs  Lab 03/21/18 1059  PROT 7.6  ALBUMIN 4.1  AST 18  ALT 24  ALKPHOS 53  BILITOT 0.9   Hematology Recent Labs  Lab 03/21/18 1059  WBC 9.1  RBC 5.00  HGB 13.8  HCT 44.2  MCV 88.4  MCH 27.6  MCHC 31.2  RDW 12.0  PLT 466*   Cardiac Enzymes Recent Labs  Lab 03/21/18 1059  TROPONINI 0.03*   No results for input(s): TROPIPOC in the last 168 hours.  BNPNo results for input(s): BNP, PROBNP in the last 168 hours.  DDimer No results for input(s): DDIMER in the last 168 hours.  Radiology/Studies:  Dg Chest 2 View  Result Date: 03/21/2018 CLINICAL DATA:  Right-sided chest pain for several hours EXAM: CHEST - 2 VIEW COMPARISON:  03/06/2018 FINDINGS: Cardiac shadow is stable. Lungs are well aerated bilaterally. No focal infiltrate or sizable effusion is seen. No acute bony abnormality is noted. IMPRESSION: No active cardiopulmonary disease. Electronically Signed   By: Alcide Clever M.D.   On: 03/21/2018 11:39    Assessment and Plan:   1. Chest pain that was present when he woke this AM, no further pain.  EKG without acute changes though is abnormal with deep T wave inversions in II<II<AVF and Lateral leads, same as D/C EKG.  Troponin 0.33 but believe this is trending down from pk of 58.12 on 03/06/18.  Reassured, continue medication and keep follow up 03/29/18 in our  [address] office.  Dr. Clifton James has seen.   2. CAD with recent MI and stents to RCA and LCX.  This is only episode of pain he has had.  None with exercise or at work.  Continue asa and Brilinta.  BB and statin.  May resume Aegis infusions.      For questions or updates, please contact [facility] Please consult [email or web address] for contact info under Cardiology/STEMI.    Signed, Nada Boozer, NP  03/21/2018 12:48 PM   I have personally seen and examined this patient. I agree with the assessment and plan as outlined above.  Danny Reid is well known to me. I participated in his care prior to discharge several weeks ago. He has been very active at home with no recurrent symptoms. He has had episodes of diaphoresis for months. Today he had atypical right sided chest pain. EKG without changes since discharge. There is sinus with TWI in the inferior and anterolateral leads. The TWI are likely his evolved MI from prior event. Troponin is slightly abnormal at 0.03 but this is likely due to his prior event and still returning to normal. He has no chest pain currently. He feels well. I have personally reviewed his EKG and labs. (see my EKG interpretation above) My exam:  General: Well developed, well nourished, NAD  HEENT: OP clear, mucus membranes moist  SKIN: warm, dry. No rashes. Neuro: No focal deficits  Musculoskeletal: Muscle strength 5/5 all ext  Psychiatric: Mood and affect normal  Neck: No JVD, no carotid bruits, no thyromegaly, no lymphadenopathy.  Lungs:Clear bilaterally, no wheezes, rhonci, crackles Cardiovascular: Regular  rate and rhythm. No murmurs, gallops or rubs. Abdomen:Soft. Bowel sounds present. Non-tender.  Extremities: No lower extremity edema. Pulses are 2 + in the bilateral DP/PT.  His chest pain is atypical and does not appear to be cardiac related. No objective evidence of ischemia. I think it is reasonable to continue current outpatient medical therapy and discharge  from the ED. He has follow up in our office next week.   Verne Carrow 03/21/2018 1:43 PM

## 2018-03-21 NOTE — ED Notes (Signed)
Cardiology at bedside with planned disp for discharge

## 2018-03-21 NOTE — ED Provider Notes (Signed)
[facility] EMERGENCY DEPARTMENT Provider Note   CSN: [id number] Arrival date & time: 03/21/18  1024     History   Chief Complaint No chief complaint on file.   HPI Danny Reid is a 36 y.o. male.  HPI 37 year old male with past medical history as below status post recent stenting for an STEMI here with chest pain.  The patient was being evaluated for a study by his cardiologist, Dr. Riley Kill, today.  Patient states that he developed a dull, aching, right-sided chest pressure.  It similar and pressure quality to his and STEMI, though in a new position.  He had associated shortness of breath and diaphoresis.  EKG showed possible peaking of T waves and he was sent here for evaluation.  He states he has been adherent with his medications.  He last took his Brilinta this morning.  Denies any specific alleviating or aggravating factors.  No other complaints.  Symptoms do seem to be worsened with exertion.   Past Medical History:  Diagnosis Date  . Acute MI (HCC) 03/05/2018   PCI to RCA and LCX  . Chickenpox   . Diabetes mellitus without complication (HCC)    borderline  . High cholesterol   . Hypertension     Patient Active Problem List   Diagnosis Date Noted  . Atypical chest pain   . NSTEMI (non-ST elevated myocardial infarction) (HCC) 03/06/2018  . Hyperlipidemia 03/06/2018  . Acute inferior myocardial infarction (HCC)   . HTN (hypertension) 03/13/2013  . Tobacco use disorder 03/13/2013    Past Surgical History:  Procedure Laterality Date  . CORONARY STENT INTERVENTION N/A 03/06/2018   Procedure: CORONARY STENT INTERVENTION;  Surgeon: Tonny Bollman, MD;  Location: [facility] INVASIVE CV LAB;  Service: Cardiovascular;  Laterality: N/A;  . CORONARY STENT INTERVENTION N/A 03/07/2018   Procedure: CORONARY STENT INTERVENTION;  Surgeon: Yvonne Kendall, MD;  Location: [facility] INVASIVE CV LAB;  Service: Cardiovascular;  Laterality: N/A;  CFX X 2 STENTS  . CORONARY/GRAFT  ACUTE MI REVASCULARIZATION N/A 03/06/2018   Procedure: Coronary/Graft Acute MI Revascularization;  Surgeon: Tonny Bollman, MD;  Location: [facility] INVASIVE CV LAB;  Service: Cardiovascular;  Laterality: N/A;  . LEFT HEART CATH AND CORONARY ANGIOGRAPHY N/A 03/06/2018   Procedure: LEFT HEART CATH AND CORONARY ANGIOGRAPHY;  Surgeon: Tonny Bollman, MD;  Location: [facility] INVASIVE CV LAB;  Service: Cardiovascular;  Laterality: N/A;        Home Medications    Prior to Admission medications   Medication Sig Start Date End Date Taking? Authorizing Provider  aspirin EC 81 MG EC tablet Take 1 tablet (81 mg total) by mouth daily. 03/09/18   Arty Baumgartner, NP  atorvastatin (LIPITOR) 80 MG tablet Take 1 tablet (80 mg total) by mouth daily at 6 PM. 03/08/18   Arty Baumgartner, NP  metFORMIN (GLUCOPHAGE-XR) 500 MG 24 hr tablet Take 500 mg by mouth at bedtime. 12/29/17   [provider]  metoprolol tartrate (LOPRESSOR) 25 MG tablet Take 0.5 tablets (12.5 mg total) by mouth 2 (two) times daily. 03/08/18   Arty Baumgartner, NP  Multiple Vitamins-Minerals (ADULT ONE DAILY GUMMIES) CHEW Chew 2 tablets by mouth daily. Centrum Gummies    [provider]  nitroGLYCERIN (NITROSTAT) 0.4 MG SL tablet Place 1 tablet (0.4 mg total) under the tongue every 5 (five) minutes as needed. 03/08/18   Arty Baumgartner, NP  ticagrelor (BRILINTA) 90 MG TABS tablet Take 1 tablet (90 mg total) by mouth 2 (two) times daily.  03/08/18   Arty Baumgartner, NP    Family History Family History  Problem Relation Age of Onset  . Hypertension Mother   . Multiple sclerosis Paternal Aunt   . Heart attack Paternal Grandfather     Social History Social History   Tobacco Use  . Smoking status: Former Smoker    Packs/day: 0.50    Years: 13.00    Pack years: 6.50    Types: Cigars    Last attempt to quit: 03/07/2018    Years since quitting: 0.0  . Smokeless tobacco: Never Used  Substance Use Topics  . Alcohol use: Yes     Comment: social  . Drug use: No     Allergies   Patient has no known allergies.   Review of Systems Review of Systems  Constitutional: Positive for diaphoresis and fatigue. Negative for chills and fever.  HENT: Negative for congestion and rhinorrhea.   Eyes: Negative for visual disturbance.  Respiratory: Positive for chest tightness and shortness of breath. Negative for cough and wheezing.   Cardiovascular: Positive for chest pain. Negative for leg swelling.  Gastrointestinal: Negative for abdominal pain, diarrhea, nausea and vomiting.  Genitourinary: Negative for dysuria and flank pain.  Musculoskeletal: Negative for neck pain and neck stiffness.  Skin: Negative for rash and wound.  Allergic/Immunologic: Negative for immunocompromised state.  Neurological: Negative for syncope, weakness and headaches.  All other systems reviewed and are negative.    Physical Exam Updated Vital Signs BP 134/85   Pulse 61   Temp 98.6 F (37 C) (Oral)   Resp 19   Ht 5\' 11"  (1.803 m)   Wt 113.4 kg (250 lb)   SpO2 100%   BMI 34.87 kg/m   Physical Exam  Constitutional: He is oriented to person, place, and time. He appears well-developed and well-nourished. No distress.  HENT:  Head: Normocephalic and atraumatic.  Eyes: Conjunctivae are normal.  Neck: Neck supple.  Cardiovascular: Normal rate, regular rhythm and normal heart sounds. Exam reveals no friction rub.  No murmur heard. Pulmonary/Chest: Effort normal and breath sounds normal. No respiratory distress. He has no wheezes. He has no rales.  Abdominal: He exhibits no distension.  Musculoskeletal: He exhibits no edema.  Neurological: He is alert and oriented to person, place, and time. He exhibits normal muscle tone.  Skin: Skin is warm. Capillary refill takes less than 2 seconds.  Psychiatric: He has a normal mood and affect.  Nursing note and vitals reviewed.    ED Treatments / Results  Labs (all labs ordered are listed, but  only abnormal results are displayed) Labs Reviewed  CBC WITH DIFFERENTIAL/PLATELET - Abnormal; Notable for the following components:      Result Value   Platelets 466 (*)    All other components within normal limits  COMPREHENSIVE METABOLIC PANEL - Abnormal; Notable for the following components:   Glucose, Bld 124 (*)    All other components within normal limits  TROPONIN I - Abnormal; Notable for the following components:   Troponin I 0.03 (*)    All other components within normal limits  LIPASE, BLOOD  I-STAT TROPONIN, ED    EKG None  Radiology Dg Chest 2 View  Result Date: 03/21/2018 CLINICAL DATA:  Right-sided chest pain for several hours EXAM: CHEST - 2 VIEW COMPARISON:  03/06/2018 FINDINGS: Cardiac shadow is stable. Lungs are well aerated bilaterally. No focal infiltrate or sizable effusion is seen. No acute bony abnormality is noted. IMPRESSION: No active cardiopulmonary disease. Electronically  Signed   By: Alcide Clever M.D.   On: 03/21/2018 11:39    Procedures Procedures (including critical care time)  Medications Ordered in ED Medications  morphine 4 MG/ML injection 4 mg (4 mg Intravenous Refused 03/21/18 1249)  aspirin chewable tablet 243 mg (243 mg Oral Given 03/21/18 1249)     Initial Impression / Assessment and Plan / ED Course  I have reviewed the triage vital signs and the nursing notes.  Pertinent labs & imaging results that were available during my care of the patient were reviewed by me and considered in my medical decision making (see chart for details).     37 year old male with history of recent end STEMI here with very atypical chest pain.  It is somewhat reproducible.  EKG initially showed peaking of T waves, possibly hyperacute.  Patient has been compliant with his meds.  Troponin negative despite constant symptoms, however, which is reassuring.  I consulted cardiology who evaluated him in the ED.  They do not feel patient needs admission and recommend  discharge home with continued antiplatelet treatment.  Return precautions given.  Final Clinical Impressions(s) / ED Diagnoses   Final diagnoses:  Atypical chest pain    ED Discharge Orders    None       Shaune Pollack, MD 03/21/18 1700

## 2018-03-21 NOTE — Progress Notes (Signed)
[facility] visit infusion #3  Pt came in this morning for infusion #3. He has been working out doing the elliptical Wednesday Thursday and Friday of last week, but no lifting.  Before IV was started patient told me that he woke up with chest discomfort and has been sweaty this morning. Right sided chest discomfort, no N/V, very sweaty, and pain scale of 3 out of 10. Text Dr Riley Kill to come evaluate patient. EKG completed with no changes per Dr Riley Kill. Dr Riley Kill advised for patient to be checked out by ER.  Took patient to ER for evaluation.   Trops were negative. Pt was discharged at 3 pm. Will come back 03/22/18 @ 9 am for next infusion. Diagnosis was atypical chest pain.  Per pt was feeling much better.                                    "CONSENT"   YES     NO   Continuing further Investigational Product and study visits for follow-up? [x]  []   Continuing consent from future biomedical research [x]  []                                     "EVENTS"    YES     NO  AE   (IF YES SEE SOURCE) [x]  []   SAE  (IF YES SEE SOURCE) []  [x]   ENDPOINT   (IF YES SEE SOURCE) []  [x]   REVASCULARIZATION  (IF YES SEE SOURCE) []  [x]   AMPUTATION   (IF YES SEE SOURCE) []  [x]   TROPONIN'S  (IF YES SEE SOURCE) [x]  []     Current Outpatient Medications:  .  aspirin EC 81 MG EC tablet, Take 1 tablet (81 mg total) by mouth daily., Disp: , Rfl:  .  atorvastatin (LIPITOR) 80 MG tablet, Take 1 tablet (80 mg total) by mouth daily at 6 PM., Disp: 90 tablet, Rfl: 2 .  metFORMIN (GLUCOPHAGE-XR) 500 MG 24 hr tablet, Take 500 mg by mouth at bedtime., Disp: , Rfl: 1 .  metoprolol tartrate (LOPRESSOR) 25 MG tablet, Take 0.5 tablets (12.5 mg total) by mouth 2 (two) times daily., Disp: 60 tablet, Rfl: 2 .  Multiple Vitamins-Minerals (ADULT ONE DAILY GUMMIES) CHEW, Chew 2 tablets by mouth daily. Centrum Gummies, Disp: , Rfl:  .  nitroGLYCERIN (NITROSTAT) 0.4 MG SL tablet, Place 1 tablet (0.4 mg total) under the tongue every 5 (five)  minutes as needed., Disp: 25 tablet, Rfl: 2 .  ticagrelor (BRILINTA) 90 MG TABS tablet, Take 1 tablet (90 mg total) by mouth 2 (two) times daily., Disp: 180 tablet, Rfl: 2 No current facility-administered medications for this visit.   Facility-Administered Medications Ordered in Other Visits:  .  AEGIS II Study - Placebo / CSL112 (PI-Hilty), 170 mL, Intravenous, Q7 days, Chrystie Nose, MD, Last Rate: 85 mL/hr at 03/22/18 0954, 170 mL at 03/22/18 0954

## 2018-03-21 NOTE — ED Triage Notes (Signed)
Pt in from 2nd floor for placebo treatment, pt c/o mid non radiating CP, pt denies SOB, denies n/v/d, pt hx 03/07/18 with MI and x 4 stent placement

## 2018-03-22 ENCOUNTER — Encounter: Payer: 59 | Admitting: *Deleted

## 2018-03-22 ENCOUNTER — Ambulatory Visit (HOSPITAL_COMMUNITY)
Admission: RE | Admit: 2018-03-22 | Discharge: 2018-03-22 | Disposition: A | Discharge status: Home / Self Care | Payer: 59 | Source: Ambulatory Visit | Attending: Internal Medicine | Admitting: Internal Medicine

## 2018-03-22 VITALS — BP 137/81 | HR 81

## 2018-03-22 DIAGNOSIS — Z006 Encounter for examination for normal comparison and control in clinical research program: Secondary | ICD-10-CM | POA: Insufficient documentation

## 2018-03-22 MED ORDER — STUDY - AEGIS II STUDY - PLACEBO OR CSL112 (PI-HILTY)
170.0000 mL | INTRAVENOUS | Status: DC
  Administered 2018-03-22: 170 mL via INTRAVENOUS
  Filled 2018-03-22: qty 170

## 2018-03-22 NOTE — Progress Notes (Signed)
Visit 4 infusion 3  Pt feeling much better. No chest pains or sob. Went to gym yesterday to do some cardio. No med changes.                                    "CONSENT"   YES     NO   Continuing further Investigational Product and study visits for follow-up? [x]  []   Continuing consent from future biomedical research [x]  []                                   "EVENTS"    YES     NO  AE   (IF YES SEE SOURCE) []  [x]   SAE  (IF YES SEE SOURCE) []  [x]   ENDPOINT   (IF YES SEE SOURCE) []  [x]   REVASCULARIZATION  (IF YES SEE SOURCE) []  [x]   AMPUTATION   (IF YES SEE SOURCE) []  [x]   TROPONIN'S  (IF YES SEE SOURCE) []  [x]     Current Outpatient Medications:  .  aspirin EC 81 MG EC tablet, Take 1 tablet (81 mg total) by mouth daily., Disp: , Rfl:  .  atorvastatin (LIPITOR) 80 MG tablet, Take 1 tablet (80 mg total) by mouth daily at 6 PM., Disp: 90 tablet, Rfl: 2 .  metFORMIN (GLUCOPHAGE-XR) 500 MG 24 hr tablet, Take 500 mg by mouth at bedtime., Disp: , Rfl: 1 .  metoprolol tartrate (LOPRESSOR) 25 MG tablet, Take 0.5 tablets (12.5 mg total) by mouth 2 (two) times daily., Disp: 60 tablet, Rfl: 2 .  Multiple Vitamins-Minerals (ADULT ONE DAILY GUMMIES) CHEW, Chew 2 tablets by mouth daily. Centrum Gummies, Disp: , Rfl:  .  nitroGLYCERIN (NITROSTAT) 0.4 MG SL tablet, Place 1 tablet (0.4 mg total) under the tongue every 5 (five) minutes as needed., Disp: 25 tablet, Rfl: 2 .  ticagrelor (BRILINTA) 90 MG TABS tablet, Take 1 tablet (90 mg total) by mouth 2 (two) times daily., Disp: 180 tablet, Rfl: 2 No current facility-administered medications for this visit.   Facility-Administered Medications Ordered in Other Visits:  .  AEGIS II Study - Placebo / CSL112 (PI-Hilty), 170 mL, Intravenous, Q7 days, Chrystie Nose, MD, Last Rate: 85 mL/hr at 03/22/18 0954, 170 mL at 03/22/18 0954

## 2018-03-22 NOTE — Patient Instructions (Signed)
     [facility]    , [place]    Phone:      March 22, 2018  Patient: Danny Reid  Date of Birth: 1981-04-20  Date of Visit: March 22, 2018    To Whom It May Concern:  Oatis Kinkade was seen and treated  on March 22, 2018.   If you have any questions or concerns, please don't hesitate to call.   Sincerely,     Mercer Pod :) RN BSN  Research Coordinator  Treatment Team:  Attending Provider: Chrystie Nose, MD

## 2018-03-28 ENCOUNTER — Ambulatory Visit (HOSPITAL_COMMUNITY)
Admission: RE | Admit: 2018-03-28 | Discharge: 2018-03-28 | Disposition: A | Discharge status: Home / Self Care | Payer: 59 | Source: Ambulatory Visit | Attending: Internal Medicine | Admitting: Internal Medicine

## 2018-03-28 ENCOUNTER — Encounter: Payer: 59 | Admitting: *Deleted

## 2018-03-28 VITALS — BP 123/73 | HR 73

## 2018-03-28 DIAGNOSIS — Z006 Encounter for examination for normal comparison and control in clinical research program: Secondary | ICD-10-CM

## 2018-03-28 MED ORDER — STUDY - AEGIS II STUDY - PLACEBO OR CSL112 (PI-HILTY)
170.0000 mL | INTRAVENOUS | Status: DC
  Administered 2018-03-28: 170 mL via INTRAVENOUS
  Filled 2018-03-28 (×2): qty 170

## 2018-03-29 ENCOUNTER — Ambulatory Visit (INDEPENDENT_AMBULATORY_CARE_PROVIDER_SITE_OTHER): Payer: 59 | Admitting: Nurse Practitioner

## 2018-03-29 ENCOUNTER — Encounter: Payer: Self-pay | Admitting: Nurse Practitioner

## 2018-03-29 ENCOUNTER — Encounter: Payer: Self-pay | Admitting: *Deleted

## 2018-03-29 ENCOUNTER — Other Ambulatory Visit: Payer: Self-pay | Admitting: *Deleted

## 2018-03-29 VITALS — BP 126/80 | HR 65 | Ht 71.0 in | Wt 247.1 lb

## 2018-03-29 DIAGNOSIS — E7849 Other hyperlipidemia: Secondary | ICD-10-CM | POA: Diagnosis not present

## 2018-03-29 DIAGNOSIS — Z955 Presence of coronary angioplasty implant and graft: Secondary | ICD-10-CM | POA: Diagnosis not present

## 2018-03-29 DIAGNOSIS — I259 Chronic ischemic heart disease, unspecified: Secondary | ICD-10-CM | POA: Diagnosis not present

## 2018-03-29 DIAGNOSIS — E7800 Pure hypercholesterolemia, unspecified: Secondary | ICD-10-CM

## 2018-03-29 DIAGNOSIS — I214 Non-ST elevation (NSTEMI) myocardial infarction: Secondary | ICD-10-CM

## 2018-03-29 NOTE — Progress Notes (Signed)
CARDIOLOGY OFFICE NOTE  Date:  03/29/2018    Danny Reid Date of Birth: October 01, 1980 Medical Record #[id number]  PCP:  Dorthula Matas, PA-C  Cardiologist: Excell Seltzer   Chief Complaint  Patient presents with  . Coronary Artery Disease  . Hypertension  . Hyperlipidemia    Post hospital visit - seen for Dr. Excell Seltzer    History of Present Illness: Danny Reid is a 37 y.o. male who presents today for a post hospital/post ER visit. Seen for Dr. Excell Seltzer.   He has a history of CAD, DM HLD, HTN and recent admit for acute MI/cardiac cath with stenting of the RCA/PDA using overlapping DES. He did not meet STE pattern.  He did have inferior lateral changes, NSTEMI on arrival to [facility] from [facility].  Also had PCI of LCX. He was discharged 03/08/18 on brilinta and ASA, statin and BB.  EF 45-50%.      Presented back to the ER last week for chest pain - in the setting of an [facility] infusion.  His troponin was trending down from his acute event. He was then seen by Dr. Clifton James and felt to be doing ok.   Comes in today. Here alone. He says he is doing well. No more chest pain. Walking an hour on the treadmill. Has not returned to full duty at his part time job - this is more physical and labor intensive. Tolerating his medicines. Working on his weight. No smoking of cigars. Thinking of cutting out alcohol as well. He now feels like he is doing ok.   Past Medical History:  Diagnosis Date  . Acute MI (HCC) 03/05/2018   PCI to RCA and LCX  . Chickenpox   . Diabetes mellitus without complication (HCC)    borderline  . High cholesterol   . Hypertension     Past Surgical History:  Procedure Laterality Date  . CORONARY STENT INTERVENTION N/A 03/06/2018   Procedure: CORONARY STENT INTERVENTION;  Surgeon: Tonny Bollman, MD;  Location: [facility] INVASIVE CV LAB;  Service: Cardiovascular;  Laterality: N/A;  . CORONARY STENT INTERVENTION N/A 03/07/2018   Procedure: CORONARY STENT INTERVENTION;   Surgeon: Yvonne Kendall, MD;  Location: [facility] INVASIVE CV LAB;  Service: Cardiovascular;  Laterality: N/A;  CFX X 2 STENTS  . CORONARY/GRAFT ACUTE MI REVASCULARIZATION N/A 03/06/2018   Procedure: Coronary/Graft Acute MI Revascularization;  Surgeon: Tonny Bollman, MD;  Location[facility] INVASIVE CV LAB;  Service: Cardiovascular;  Laterality: N/A;  . LEFT HEART CATH AND CORONARY ANGIOGRAPHY N/A 03/06/2018   Procedure: LEFT HEART CATH AND CORONARY ANGIOGRAPHY;  Surgeon: Tonny Bollman, MD;  Location: [facility] INVASIVE CV LAB;  Service: Cardiovascular;  Laterality: N/A;     Medications: Current Meds  Medication Sig  . aspirin EC 81 MG EC tablet Take 1 tablet (81 mg total) by mouth daily.  Marland Kitchen atorvastatin (LIPITOR) 80 MG tablet Take 1 tablet (80 mg total) by mouth daily at 6 PM.  . metFORMIN (GLUCOPHAGE-XR) 500 MG 24 hr tablet Take 500 mg by mouth at bedtime.  . metoprolol tartrate (LOPRESSOR) 25 MG tablet Take 0.5 tablets (12.5 mg total) by mouth 2 (two) times daily.  . Multiple Vitamins-Minerals (ADULT ONE DAILY GUMMIES) CHEW Chew 2 tablets by mouth daily. Centrum Gummies  . nitroGLYCERIN (NITROSTAT) 0.4 MG SL tablet Place 1 tablet (0.4 mg total) under the tongue every 5 (five) minutes as needed.  . ticagrelor (BRILINTA) 90 MG TABS tablet Take 1 tablet (90 mg total) by mouth 2 (  two) times daily.     Allergies: No Known Allergies  Social History: The patient  reports that he quit smoking about 3 weeks ago. His smoking use included cigars. He has a 6.50 pack-year smoking history. He has never used smokeless tobacco. He reports that he drinks alcohol. He reports that he does not use drugs.   Family History: The patient's family history includes Heart attack in his paternal grandfather; Hypertension in his mother; Multiple sclerosis in his paternal aunt.   Review of Systems: Please see the history of present illness.   Otherwise, the review of systems is positive for none.   All other systems are reviewed  and negative.   Physical Exam: VS:  BP 126/80 (BP Location: Left Arm, Patient Position: Sitting, Cuff Size: Large)   Pulse 65   Ht 5\' 11"  (1.803 m)   Wt 247 lb 1.9 oz (112.1 kg)   SpO2 97% Comment: at rest  BMI 34.47 kg/m  .  BMI Body mass index is 34.47 kg/m.  Wt Readings from Last 3 Encounters:  03/29/18 247 lb 1.9 oz (112.1 kg)  03/28/18 243 lb (110.2 kg)  03/21/18 250 lb (113.4 kg)    General: Pleasant. Well developed, well nourished and in no acute distress.   HEENT: Normal.  Neck: Supple, no JVD, carotid bruits, or masses noted.  Cardiac: Regular rate and rhythm. No murmurs, rubs, or gallops. No edema.  Respiratory:  Lungs are clear to auscultation bilaterally with normal work of breathing.  GI: Soft and nontender.  MS: No deformity or atrophy. Gait and ROM intact.  Skin: Warm and dry. Color is normal.  Neuro:  Strength and sensation are intact and no gross focal deficits noted.  Psych: Alert, appropriate and with normal affect.   LABORATORY DATA:  EKG:  EKG is not ordered today.  Lab Results  Component Value Date   WBC 9.1 03/21/2018   HGB 13.8 03/21/2018   HCT 44.2 03/21/2018   PLT 466 (H) 03/21/2018   GLUCOSE 124 (H) 03/21/2018   CHOL 150 03/07/2018   TRIG 222 (H) 03/07/2018   HDL 36 (L) 03/07/2018   LDLDIRECT 114.1 03/13/2013   LDLCALC 70 03/07/2018   ALT 24 03/21/2018   AST 18 03/21/2018   NA 141 03/21/2018   K 4.4 03/21/2018   CL 105 03/21/2018   CREATININE 0.94 03/21/2018   BUN 9 03/21/2018   CO2 26 03/21/2018   TSH 1.08 03/13/2013   HGBA1C 6.0 (H) 03/06/2018     BNP (last 3 results) Recent Labs    03/06/18 1049  BNP 81.6    ProBNP (last 3 results) No results for input(s): PROBNP in the last 8760 hours.   Other Studies Reviewed Today:  Echo 03/07/18 Study Conclusions  - Left ventricle: The cavity size was normal. Wall thickness was increased in a pattern of mild LVH. Systolic function was mildly reduced. The estimated  ejection fraction was in the range of 45% to 50%. There is hypokinesis of the inferolateral myocardium. The study is not technically sufficient to allow evaluation of LV diastolic function. - Aortic valve: There was trivial regurgitation. - Mitral valve: There was mild regurgitation.  Impressions:  - Hypokinesis of the inferolateral wall with overall mild LV dysfunction; mild LVH; trace AI; mild MR.   Cardiac cath 03/06/18  Dist RCA lesion is 100% stenosed.  Ost RPDA to RPDA lesion is 75% stenosed.  There is moderate left ventricular systolic dysfunction.  LV end diastolic pressure is moderately elevated.  The left ventricular ejection fraction is 35-45% by visual estimate.  Ost Cx to Prox Cx lesion is 40% stenosed.  Prox Cx to Mid Cx lesion is 90% stenosed.  Ramus lesion is 50% stenosed.  A drug-eluting stent was successfully placed using a STENT RESOLUTE ONYX 3.0X18.  Post intervention, there is a 0% residual stenosis.  Post intervention, there is a 0% residual stenosis.  A drug-eluting stent was successfully placed using a STENT RESOLUTE ONYX 4.0X26.  1. Acute total occlusion of the distal RCA with heavy thrombus, with severe stenosis extending into the distal RCA bifurcation of the PDA and PLA branches 2. Severe stenosis of the mid left circumflex extending into the second obtuse marginal branch 3. Mild nonobstructive disease of the left main, LAD, and ramus intermedius 4. Moderate segmental LV dysfunction with akinesis of the entire inferior wall, LVEF estimated at approximately 40% 5. 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 Successful PCI of the distal RCA and ostial PDA with overlapping drug-eluting stents  Recommendations: Continue Aggrastat x12 hours. The patient is loaded with ticagrelor 180 mg in the cardiac Cath Lab. Aggressive secondary risk reduction  measures.  Recommend uninterrupted dual antiplatelet therapy with Aspirin 81mg  daily and Ticagrelor 90mg  twice dailyfor a minimum of 12 months (ACS - Class I recommendation).  PCI 03/07/18 Conclusions: 1. Multivessel CAD, unchanged from the conclusion of yesterday's LHC/PCI, including severe mid/distal LCx disease. 2. Widely patent distal RCA/rPDA stents with improved flow in the distal branches following tirofiban therapy. 3. Moderately elevated left ventricular filling pressure (LVEDP ~25 mmHg). 4. Successful PCI to proximal/mid LCx extending into ostium of OM3 using overlapping Resolute Onyx 2.5 x 12 mm (proximal) and Resolute Onyx 2.25 x 15 mm (distal) drug eluting stents with 0% residual stenosis and TIMI-3 flow.  Recommendations: 1. Aggressive secondary prevention. 2. Administer furosemide 20 mg IV x 1 given moderately elevated LVEDP in the setting of moderately reduced LVEF. 3. Optimize evidence-based heart failure therapy, as heart rate, blood pressure, and renal function allow. 4. Likely discharge home tomorrow if stable overnight.  Recommend uninterrupted dual antiplatelet therapy with Aspirin 81mg  daily and Ticagrelor 90mg  twice dailyfor a minimum of 12 months (ACS - Class I recommendation).    Assessment/Plan:  1. CAD with recent MI with stents to the RCA and LCX - he is on DAPT with aspirin and Brilinta. Doing well. Did present back to the ER last week - reassured. Continue with CV risk factor modification. He may be able to do cardiac rehab - he would at least like to discuss with them. He may return to work at [employer] without any restriction as of August 12th. Work notes given today.   2. HLD - on statin - needs fasting labs in about a month  3. DM - borderline - he seems to be quite motivated to work on life style  4. Cigar use - he has stopped.   Current medicines are reviewed with the patient today.  The patient does not have concerns regarding medicines other than  what has been noted above.  The following changes have been made:  See above.  Labs/ tests ordered today include:   No orders of the defined types were placed in this encounter.    Disposition:   FU with Dr. Excell Seltzer in a few months. I am happy to see back as needed.    Patient is agreeable to this plan and will call if any problems develop  in the interim.   SignedNorma Fredrickson, NP  03/29/2018 11:17 AM  [facility] [address] [place], [place]  [address] Phone: [phone] Fax: [phone]

## 2018-03-29 NOTE — Patient Instructions (Addendum)
We will be checking the following labs today - NONE  Fasting labs in one month - lipids, LFT, BMET and CBC   Medication Instructions:    Continue with your current medicines.     Testing/Procedures To Be Arranged:  N/A  Follow-Up:   See Dr. Excell Seltzer or me in about 1 to 2 months    Other Special Instructions:   I have sent a note to cardiac rehab    If you need a refill on your cardiac medications before your next appointment, please call your pharmacy.   Call the [facility] office at [phone] if you have any questions, problems or concerns.

## 2018-03-30 ENCOUNTER — Telehealth (HOSPITAL_COMMUNITY): Payer: Self-pay

## 2018-03-30 NOTE — Telephone Encounter (Signed)
Called to schedule patient for Cardiac Rehab - patient stated he is waiting on his ride to go to the lunch and will call back when he can. If no phone call, will follow up.

## 2018-04-04 ENCOUNTER — Encounter: Payer: 59 | Admitting: *Deleted

## 2018-04-04 ENCOUNTER — Encounter: Payer: Self-pay | Admitting: Cardiology

## 2018-04-04 VITALS — BP 138/83 | HR 65 | Resp 16 | Ht 71.0 in | Wt 242.0 lb

## 2018-04-04 DIAGNOSIS — I214 Non-ST elevation (NSTEMI) myocardial infarction: Secondary | ICD-10-CM

## 2018-04-04 NOTE — Progress Notes (Signed)
Visit 5 infusion 4  Pt doing well. No complaints of cp or sob. Meds all the same. PK only central lab drawn today.  Per Amelia Jo RN                                    "CONSENT"   YES     NO   Continuing further Investigational Product and study visits for follow-up? [x]  []   Continuing consent from future biomedical research [x]  []                                   "EVENTS"    YES     NO  AE   (IF YES SEE SOURCE) []  [x]   SAE  (IF YES SEE SOURCE) []  [x]   ENDPOINT   (IF YES SEE SOURCE) []  [x]   REVASCULARIZATION  (IF YES SEE SOURCE) []  [x]   AMPUTATION   (IF YES SEE SOURCE) []  [x]   TROPONIN'S  (IF YES SEE SOURCE) []  [x]     Current Outpatient Medications:  .  aspirin EC 81 MG EC tablet, Take 1 tablet (81 mg total) by mouth daily., Disp: , Rfl:  .  atorvastatin (LIPITOR) 80 MG tablet, Take 1 tablet (80 mg total) by mouth daily at 6 PM., Disp: 90 tablet, Rfl: 2 .  metFORMIN (GLUCOPHAGE-XR) 500 MG 24 hr tablet, Take 500 mg by mouth at bedtime., Disp: , Rfl: 1 .  metoprolol tartrate (LOPRESSOR) 25 MG tablet, Take 0.5 tablets (12.5 mg total) by mouth 2 (two) times daily., Disp: 60 tablet, Rfl: 2 .  Multiple Vitamins-Minerals (ADULT ONE DAILY GUMMIES) CHEW, Chew 2 tablets by mouth daily. Centrum Gummies, Disp: , Rfl:  .  nitroGLYCERIN (NITROSTAT) 0.4 MG SL tablet, Place 1 tablet (0.4 mg total) under the tongue every 5 (five) minutes as needed., Disp: 25 tablet, Rfl: 2 .  ticagrelor (BRILINTA) 90 MG TABS tablet, Take 1 tablet (90 mg total) by mouth 2 (two) times daily., Disp: 180 tablet, Rfl: 2

## 2018-04-04 NOTE — Progress Notes (Signed)
Visit 6  No complaints of chest pain or sob. Doing well.  Central labs drawn today.  Mardella Layman NP saw patient in clinic today.                                   "CONSENT"   YES     NO   Continuing further Investigational Product and study visits for follow-up? [x]  []   Continuing consent from future biomedical research [x]  []                                   "EVENTS"    YES     NO  AE   (IF YES SEE SOURCE) []  [x]   SAE  (IF YES SEE SOURCE) []  [x]   ENDPOINT   (IF YES SEE SOURCE) []  [x]   REVASCULARIZATION  (IF YES SEE SOURCE) []  [x]   AMPUTATION   (IF YES SEE SOURCE) []  [x]   TROPONIN'S  (IF YES SEE SOURCE) []  [x]         Current Outpatient Medications:  .  aspirin EC 81 MG EC tablet, Take 1 tablet (81 mg total) by mouth daily., Disp: , Rfl:  .  atorvastatin (LIPITOR) 80 MG tablet, Take 1 tablet (80 mg total) by mouth daily at 6 PM., Disp: 90 tablet, Rfl: 2 .  metFORMIN (GLUCOPHAGE-XR) 500 MG 24 hr tablet, Take 500 mg by mouth at bedtime., Disp: , Rfl: 1 .  metoprolol tartrate (LOPRESSOR) 25 MG tablet, Take 0.5 tablets (12.5 mg total) by mouth 2 (two) times daily., Disp: 60 tablet, Rfl: 2 .  Multiple Vitamins-Minerals (ADULT ONE DAILY GUMMIES) CHEW, Chew 2 tablets by mouth daily. Centrum Gummies, Disp: , Rfl:  .  nitroGLYCERIN (NITROSTAT) 0.4 MG SL tablet, Place 1 tablet (0.4 mg total) under the tongue every 5 (five) minutes as needed., Disp: 25 tablet, Rfl: 2 .  ticagrelor (BRILINTA) 90 MG TABS tablet, Take 1 tablet (90 mg total) by mouth 2 (two) times daily., Disp: 180 tablet, Rfl: 2

## 2018-04-04 NOTE — Progress Notes (Signed)
Physical Exam for AEGIS-Trial  General: Well developed, well nourished, male appearing in no acute distress. Head: Normocephalic, atraumatic.  Neck: Supple without bruits, JVD. Lungs:  Resp regular and unlabored, CTA. Heart: RRR, S1, S2, no S3, S4, or murmur; no rub. Abdomen: Soft, non-tender, non-distended with normoactive bowel sounds. No hepatomegaly. No rebound/guarding. No obvious abdominal masses. Extremities: No clubbing, cyanosis, edema. Distal pedal pulses are 2+ bilaterally.  Neuro: Alert and oriented X 3. Moves all extremities spontaneously. Psych: Normal affect.  SignedLaverda Page, NP-C 04/04/2018, 9:30 AM Pager: [phone]

## 2018-04-06 NOTE — Telephone Encounter (Signed)
Called to schedule pt for Cardiac Rehab, pt stated he will have to check his work schedule 1st and give [place] a call back later today.  If no call will follow up with patient in a week.

## 2018-04-08 NOTE — Progress Notes (Signed)
PK

## 2018-04-13 ENCOUNTER — Encounter (HOSPITAL_COMMUNITY): Payer: Self-pay

## 2018-04-13 ENCOUNTER — Telehealth (HOSPITAL_COMMUNITY): Payer: Self-pay

## 2018-04-13 NOTE — Telephone Encounter (Signed)
Attempted to call pt a 2nd time- LM ON VM ° °Mailed letter out °

## 2018-04-20 ENCOUNTER — Telehealth (HOSPITAL_COMMUNITY): Payer: Self-pay

## 2018-04-20 NOTE — Telephone Encounter (Signed)
3rd attempt to contact patient in regards to Cardiac Rehab - mailbox is full.

## 2018-04-21 NOTE — Research (Signed)
Patient stable.  Seen in the 2H CVICU.  No complaints.  Lungs clear. Cardiac stable.  No edema.  Killip Class I.  Study discussed and questions answered.

## 2018-04-21 NOTE — Progress Notes (Signed)
Patient was seen and evaluated with Ms. Lutterloh at the time of visit.  Patient developed symptoms this am prior to visit.  He stated to [place] that he would have gone to the Emergency room in the absence of a scheduled research visit.  Discussed in more detail.  Patients symptoms are atypical.  No obvious change in ECG.  I recommended an ER visit, and discussed case with [facility] nurse in the ER.

## 2018-04-22 ENCOUNTER — Encounter: Payer: Self-pay | Admitting: Cardiovascular Disease

## 2018-04-22 ENCOUNTER — Other Ambulatory Visit: Payer: 59 | Admitting: *Deleted

## 2018-04-22 DIAGNOSIS — E7800 Pure hypercholesterolemia, unspecified: Secondary | ICD-10-CM

## 2018-04-22 DIAGNOSIS — I214 Non-ST elevation (NSTEMI) myocardial infarction: Secondary | ICD-10-CM

## 2018-04-22 LAB — BASIC METABOLIC PANEL
BUN/Creatinine Ratio: 10 (ref 9–20)
BUN: 9 mg/dL (ref 6–20)
CO2: 21 mmol/L (ref 20–29)
Calcium: 9.6 mg/dL (ref 8.7–10.2)
Chloride: 101 mmol/L (ref 96–106)
Creatinine, Ser: 0.92 mg/dL (ref 0.76–1.27)
GFR calc Af Amer: 121 mL/min/{1.73_m2} (ref >59)
GFR calc non Af Amer: 104 mL/min/{1.73_m2} (ref >59)
Glucose: 98 mg/dL (ref 65–99)
Potassium: 4.3 mmol/L (ref 3.5–5.2)
Sodium: 138 mmol/L (ref 134–144)

## 2018-04-22 LAB — CBC WITH DIFFERENTIAL/PLATELET
Basophils Absolute: 0.1 10*3/uL (ref 0.0–0.2)
Basos: 1 %
EOS (ABSOLUTE): 0.5 10*3/uL — ABNORMAL HIGH (ref 0.0–0.4)
Eos: 8 %
Hematocrit: 42.2 % (ref 37.5–51.0)
Hemoglobin: 14.1 g/dL (ref 13.0–17.7)
Immature Grans (Abs): 0 10*3/uL (ref 0.0–0.1)
Immature Granulocytes: 0 %
Lymphocytes Absolute: 2 10*3/uL (ref 0.7–3.1)
Lymphs: 28 %
MCH: 28.3 pg (ref 26.6–33.0)
MCHC: 33.4 g/dL (ref 31.5–35.7)
MCV: 85 fL (ref 79–97)
Monocytes Absolute: 0.4 10*3/uL (ref 0.1–0.9)
Monocytes: 6 %
Neutrophils Absolute: 4 10*3/uL (ref 1.4–7.0)
Neutrophils: 57 %
Platelets: 366 10*3/uL (ref 150–450)
RBC: 4.99 x10E6/uL (ref 4.14–5.80)
RDW: 13.6 % (ref 12.3–15.4)
WBC: 7.1 10*3/uL (ref 3.4–10.8)

## 2018-04-22 LAB — HEPATIC FUNCTION PANEL
ALT: 23 IU/L (ref 0–44)
AST: 20 IU/L (ref 0–40)
Albumin: 4.9 g/dL (ref 3.5–5.5)
Alkaline Phosphatase: 60 IU/L (ref 39–117)
Bilirubin Total: 0.8 mg/dL (ref 0.0–1.2)
Bilirubin, Direct: 0.21 mg/dL (ref 0.00–0.40)
Total Protein: 7.3 g/dL (ref 6.0–8.5)

## 2018-04-22 LAB — LIPID PANEL
Chol/HDL Ratio: 2.5 ratio (ref 0.0–5.0)
Cholesterol, Total: 92 mg/dL — ABNORMAL LOW (ref 100–199)
HDL: 37 mg/dL — ABNORMAL LOW (ref >39)
LDL Calculated: 38 mg/dL (ref 0–99)
Triglycerides: 83 mg/dL (ref 0–149)
VLDL Cholesterol Cal: 17 mg/dL (ref 5–40)

## 2018-05-13 ENCOUNTER — Encounter: Payer: Self-pay | Admitting: *Deleted

## 2018-05-13 DIAGNOSIS — Z006 Encounter for examination for normal comparison and control in clinical research program: Secondary | ICD-10-CM

## 2018-05-18 NOTE — Addendum Note (Signed)
Encounter addended by: Elaina Pattee, RN on: 05/18/2018 11:01 AM  Actions taken: Charge Capture section accepted

## 2018-05-18 NOTE — Addendum Note (Signed)
Encounter addended by: Elaina Pattee, RN on: 05/18/2018 11:02 AM  Actions taken: Charge Capture section accepted

## 2018-05-18 NOTE — Research (Signed)
[facility] Visit 7 Called pt, he is doing well. No complaints of cp or sob. No med changes.                                     "CONSENT"   YES     NO   Continuing further Investigational Product and study visits for follow-up? [x]  []   Continuing consent from future biomedical research [x]  []                                    "EVENTS"    YES     NO  AE   (IF YES SEE SOURCE) []  [x]   SAE  (IF YES SEE SOURCE) []  [x]   ENDPOINT   (IF YES SEE SOURCE) []  [x]   REVASCULARIZATION  (IF YES SEE SOURCE) []  [x]   AMPUTATION   (IF YES SEE SOURCE) []  [x]   TROPONIN'S  (IF YES SEE SOURCE) []  [x]    Current Outpatient Medications:  .  aspirin EC 81 MG EC tablet, Take 1 tablet (81 mg total) by mouth daily., Disp: , Rfl:  .  atorvastatin (LIPITOR) 80 MG tablet, Take 1 tablet (80 mg total) by mouth daily at 6 PM., Disp: 90 tablet, Rfl: 2 .  metFORMIN (GLUCOPHAGE-XR) 500 MG 24 hr tablet, Take 500 mg by mouth at bedtime., Disp: , Rfl: 1 .  metoprolol tartrate (LOPRESSOR) 25 MG tablet, Take 0.5 tablets (12.5 mg total) by mouth 2 (two) times daily., Disp: 60 tablet, Rfl: 2 .  Multiple Vitamins-Minerals (ADULT ONE DAILY GUMMIES) CHEW, Chew 2 tablets by mouth daily. Centrum Gummies, Disp: , Rfl:  .  nitroGLYCERIN (NITROSTAT) 0.4 MG SL tablet, Place 1 tablet (0.4 mg total) under the tongue every 5 (five) minutes as needed., Disp: 25 tablet, Rfl: 2 .  ticagrelor (BRILINTA) 90 MG TABS tablet, Take 1 tablet (90 mg total) by mouth 2 (two) times daily., Disp: 180 tablet, Rfl: 2

## 2018-05-19 ENCOUNTER — Encounter: Payer: Self-pay | Admitting: Cardiovascular Disease

## 2018-05-19 ENCOUNTER — Ambulatory Visit (INDEPENDENT_AMBULATORY_CARE_PROVIDER_SITE_OTHER): Payer: 59 | Admitting: Cardiovascular Disease

## 2018-05-19 VITALS — BP 120/82 | HR 66 | Ht 71.0 in | Wt 243.8 lb

## 2018-05-19 DIAGNOSIS — I2510 Atherosclerotic heart disease of native coronary artery without angina pectoris: Secondary | ICD-10-CM

## 2018-05-19 DIAGNOSIS — E782 Mixed hyperlipidemia: Secondary | ICD-10-CM | POA: Diagnosis not present

## 2018-05-19 DIAGNOSIS — E118 Type 2 diabetes mellitus with unspecified complications: Secondary | ICD-10-CM

## 2018-05-19 NOTE — Progress Notes (Signed)
Cardiology Office Note:    Date:  05/21/2018   ID:  Danny Reid, DOB Jun 02, 1981, MRN [id number]  PCP:  Danny Matas, PA-C  Cardiologist:  Danny Bollman, MD  Electrophysiologist:  None   Referring MD: Danny Matas, PA-C   Chief Complaint  Patient presents with  . Coronary Artery Disease    History of Present Illness:    Danny Reid is a 37 y.o. male with a hx of coronary artery disease, presenting for follow-up evaluation.  In July 2018 the patient presented with non-STEMI and was found to have severe stenosis of the right coronary artery, PDA branch, and left circumflex.  He underwent multivessel PCI with drug-eluting stents.  LV function was mildly impaired with an LV ejection fraction of  45 to 50%.  The patient is here alone today.  He is back to working full-time and is doing quite well.  He is had a few episodes of chest discomfort "scared him."  He was evaluated emergency department with negative testing.today he denies symptoms of chest pain, chest pressure, or shortness of breath.  He said no leg swelling, heart palpitations, orthopnea, or PND.  He is compliant with his medications.  He has changed his lifestyle and is eating much healthier.  He switched from fried to grilled foods.  He exercises sporadically, but is quite active with his work.  He is no longer smoking cigars which he was previously doing on occasion.  Past Medical History:  Diagnosis Date  . Acute MI (HCC) 03/05/2018   PCI to RCA and LCX  . Chickenpox   . Diabetes mellitus without complication (HCC)    borderline  . High cholesterol   . Hypertension     Past Surgical History:  Procedure Laterality Date  . CORONARY STENT INTERVENTION N/A 03/06/2018   Procedure: CORONARY STENT INTERVENTION;  Surgeon: Danny Bollman, MD;  Location: [facility] INVASIVE CV LAB;  Service: Cardiovascular;  Laterality: N/A;  . CORONARY STENT INTERVENTION N/A 03/07/2018   Procedure: CORONARY STENT INTERVENTION;  Surgeon: Danny Kendall, MD;  Location: [facility] INVASIVE CV LAB;  Service: Cardiovascular;  Laterality: N/A;  CFX X 2 STENTS  . CORONARY/GRAFT ACUTE MI REVASCULARIZATION N/A 03/06/2018   Procedure: Coronary/Graft Acute MI Revascularization;  Surgeon: Danny Bollman, MD;  Location: [facility] INVASIVE CV LAB;  Service: Cardiovascular;  Laterality: N/A;  . LEFT HEART CATH AND CORONARY ANGIOGRAPHY N/A 03/06/2018   Procedure: LEFT HEART CATH AND CORONARY ANGIOGRAPHY;  Surgeon: Danny Bollman, MD;  Location: [facility] INVASIVE CV LAB;  Service: Cardiovascular;  Laterality: N/A;    Current Medications: Current Meds  Medication Sig  . aspirin EC 81 MG EC tablet Take 1 tablet (81 mg total) by mouth daily.  Marland Kitchen atorvastatin (LIPITOR) 80 MG tablet Take 1 tablet (80 mg total) by mouth daily at 6 PM.  . metFORMIN (GLUCOPHAGE-XR) 500 MG 24 hr tablet Take 500 mg by mouth at bedtime.  . metoprolol tartrate (LOPRESSOR) 25 MG tablet Take 0.5 tablets (12.5 mg total) by mouth 2 (two) times daily.  . Multiple Vitamins-Minerals (ADULT ONE DAILY GUMMIES) CHEW Chew 2 tablets by mouth daily. Centrum Gummies  . nitroGLYCERIN (NITROSTAT) 0.4 MG SL tablet Place 1 tablet (0.4 mg total) under the tongue every 5 (five) minutes as needed.  . ticagrelor (BRILINTA) 90 MG TABS tablet Take 1 tablet (90 mg total) by mouth 2 (two) times daily.     Allergies:   Patient has no known allergies.   Social History   Socioeconomic History  .  Marital status: Single    Spouse name: Not on file  . Number of children: Not on file  . Years of education: Not on file  . Highest education level: Not on file  Occupational History  . Not on file  Social Needs  . Financial resource strain: Not hard at all  . Food insecurity:    Worry: Never true    Inability: Never true  . Transportation needs:    Medical: No    Non-medical: No  Tobacco Use  . Smoking status: Former Smoker    Packs/day: 0.50    Years: 13.00    Pack years: 6.50    Types: Cigars    Last attempt  to quit: 03/07/2018    Years since quitting: 0.2  . Smokeless tobacco: Never Used  Substance and Sexual Activity  . Alcohol use: Yes    Comment: social  . Drug use: No  . Sexual activity: Not on file  Lifestyle  . Physical activity:    Days per week: 3 days    Minutes per session: 20 min  . Stress: Only a little  Relationships  . Social connections:    Talks on phone: More than three times a week    Gets together: More than three times a week    Attends religious service: Never    Active member of club or organization: Yes    Attends meetings of clubs or organizations: 1 to 4 times per year    Relationship status: Never married  Other Topics Concern  . Not on file  Social History Narrative  . Not on file     Family History: The patient's family history includes Heart attack in his paternal grandfather; Hypertension in his mother; Multiple sclerosis in his paternal aunt.  ROS:   Please see the history of present illness.    Positive for snoring. All other systems reviewed and are negative.  EKGs/Labs/Other Studies Reviewed:    The following studies were reviewed today: Echo 03/07/18 Study Conclusions  - Left ventricle: The cavity size was normal. Wall thickness was increased in a pattern of mild LVH. Systolic function was mildly reduced. The estimated ejection fraction was in the range of 45% to 50%. There is hypokinesis of the inferolateral myocardium. The study is not technically sufficient to allow evaluation of LV diastolic function. - Aortic valve: There was trivial regurgitation. - Mitral valve: There was mild regurgitation.  Impressions:  - Hypokinesis of the inferolateral wall with overall mild LV dysfunction; mild LVH; trace AI; mild MR.   Cardiac cath 03/06/18  Dist RCA lesion is 100% stenosed.  Ost RPDA to RPDA lesion is 75% stenosed.  There is moderate left ventricular systolic dysfunction.  LV end diastolic pressure is moderately  elevated.  The left ventricular ejection fraction is 35-45% by visual estimate.  Ost Cx to Prox Cx lesion is 40% stenosed.  Prox Cx to Mid Cx lesion is 90% stenosed.  Ramus lesion is 50% stenosed.  A drug-eluting stent was successfully placed using a STENT RESOLUTE ONYX 3.0X18.  Post intervention, there is a 0% residual stenosis.  Post intervention, there is a 0% residual stenosis.  A drug-eluting stent was successfully placed using a STENT RESOLUTE ONYX 4.0X26.  1. Acute total occlusion of the distal RCA with heavy thrombus, with severe stenosis extending into the distal RCA bifurcation of the PDA and PLA branches 2. Severe stenosis of the mid left circumflex extending into the second obtuse marginal branch 3. Mild nonobstructive  disease of the left main, LAD, and ramus intermedius 4. Moderate segmental LV dysfunction with akinesis of the entire inferior wall, LVEF estimated at approximately 40% 5. 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 Successful PCI of the distal RCA and ostial PDA with overlapping drug-eluting stents  Recommendations: Continue Aggrastat x12 hours. The patient is loaded with ticagrelor 180 mg in the cardiac Cath Lab. Aggressive secondary risk reduction measures.  Recommend uninterrupted dual antiplatelet therapy with Aspirin 81mg  daily and Ticagrelor 90mg  twice dailyfor a minimum of 12 months (ACS - Class I recommendation).  PCI 03/07/18 Conclusions: 1. Multivessel CAD, unchanged from the conclusion of yesterday's LHC/PCI, including severe mid/distal LCx disease. 2. Widely patent distal RCA/rPDA stents with improved flow in the distal branches following tirofiban therapy. 3. Moderately elevated left ventricular filling pressure (LVEDP ~25 mmHg). 4. Successful PCI to proximal/mid LCx extending into ostium of OM3 using overlapping Resolute Onyx 2.5 x 12 mm (proximal) and Resolute  Onyx 2.25 x 15 mm (distal) drug eluting stents with 0% residual stenosis and TIMI-3 flow.  Recommendations: 1. Aggressive secondary prevention. 2. Administer furosemide 20 mg IV x 1 given moderately elevated LVEDP in the setting of moderately reduced LVEF. 3. Optimize evidence-based heart failure therapy, as heart rate, blood pressure, and renal function allow. 4. Likely discharge home tomorrow if stable overnight.  Recommend uninterrupted dual antiplatelet therapy with Aspirin 81mg  daily and Ticagrelor 90mg  twice dailyfor a minimum of 12 months (ACS - Class I recommendation).  Recent Labs: 03/06/2018: B Natriuretic Peptide 81.6 04/22/2018: ALT 23; BUN 9; Creatinine, Ser 0.92; Hemoglobin 14.1; Platelets 366; Potassium 4.3; Sodium 138  Recent Lipid Panel    Component Value Date/Time   CHOL 92 (L) 04/22/2018 0833   TRIG 83 04/22/2018 0833   HDL 37 (L) 04/22/2018 0833   CHOLHDL 2.5 04/22/2018 0833   CHOLHDL 4.2 03/07/2018 0233   VLDL 44 (H) 03/07/2018 0233   LDLCALC 38 04/22/2018 0833   LDLDIRECT 114.1 03/13/2013 0838    Physical Exam:    VS:  BP 120/82   Pulse 66   Ht 5\' 11"  (1.803 m)   Wt 243 lb 12.8 oz (110.6 kg)   SpO2 96%   BMI 34.00 kg/m     Wt Readings from Last 3 Encounters:  05/19/18 243 lb 12.8 oz (110.6 kg)  04/04/18 242 lb (109.8 kg)  03/29/18 247 lb 1.9 oz (112.1 kg)     GEN: Well nourished, well developed in no acute distress HEENT: Normal NECK: No JVD; No carotid bruits LYMPHATICS: No lymphadenopathy CARDIAC: RRR, no murmurs, rubs, gallops RESPIRATORY:  Clear to auscultation without rales, wheezing or rhonchi  ABDOMEN: Soft, non-tender, non-distended MUSCULOSKELETAL:  No edema; No deformity  SKIN: Warm and dry NEUROLOGIC:  Alert and oriented x 3 PSYCHIATRIC:  Normal affect   ASSESSMENT:    1. Coronary artery disease involving native coronary artery of native heart without angina pectoris   2. Mixed hyperlipidemia   3. Type 2 diabetes mellitus  with complication, without long-term current use of insulin (HCC)    PLAN:    In order of problems listed above:  1. The patient appears stable without symptoms of angina.  He will continue on dual antiplatelet therapy with aspirin and ticagrelor, high intensity statin drug, and a beta-blocker. 2. The patient's recent lipids are reviewed with a total cholesterol of 92, HDL 37, LDL 38.  He will continue on atorvastatin 80 mg daily. 3. Diet/lifestyle modification discussed.  He continues to work on his diet and he has successfully lost over 10 pounds since he presented with his non-ST elevation MI.    Medication Adjustments/Labs and Tests Ordered: Current medicines are reviewed at length with the patient today.  Concerns regarding medicines are outlined above.  No orders of the defined types were placed in this encounter.  No orders of the defined types were placed in this encounter.   Patient Instructions  Medication Instructions:  Your provider recommends that you continue on your current medications as directed. Please refer to the Current Medication list given to you today.    Labwork: None  Testing/Procedures: None  Follow-Up: You have an appointment with Dr. Earmon Phoenix assistant, Lorin Picket, on September 19, 2018 at 8:45AM.   Any Other Special Instructions Will Be Listed Below (If Applicable).     If you need a refill on your cardiac medications before your next appointment, please call your pharmacy.      Signed, Danny Bollman, MD  05/21/2018 8:28 AM    [facility]

## 2018-05-19 NOTE — Patient Instructions (Addendum)
Medication Instructions:  Your provider recommends that you continue on your current medications as directed. Please refer to the Current Medication list given to you today.    Labwork: None  Testing/Procedures: None  Follow-Up: You have an appointment with Dr. Earmon Phoenix assistant, Lorin Picket, on September 19, 2018 at 8:45AM.   Any Other Special Instructions Will Be Listed Below (If Applicable).     If you need a refill on your cardiac medications before your next appointment, please call your pharmacy.

## 2018-06-29 ENCOUNTER — Encounter: Payer: Self-pay | Admitting: *Deleted

## 2018-06-29 ENCOUNTER — Encounter: Payer: 59 | Admitting: *Deleted

## 2018-06-29 VITALS — BP 127/78 | HR 59 | Resp 18 | Wt 243.6 lb

## 2018-06-29 DIAGNOSIS — Z006 Encounter for examination for normal comparison and control in clinical research program: Secondary | ICD-10-CM

## 2018-06-29 NOTE — Research (Signed)
      [facility]    ,     Phone:      June 29, 2018  Patient: Danny Reid  Date of Birth: 07/04/1981  Date of Visit: June 29, 2018    To Whom It May Concern:  Delvonta Rootes was seen and treated on June 29, 2018    .           If you have any questions or concerns, please don't hesitate to call.   Sincerely,     Mercer Pod :) RN   Treatment Team:  Attending Provider: Chrystie Nose, MD

## 2018-06-29 NOTE — Research (Signed)
[facility] visit 8  Pt doing well, no complaints of cp or sob. No changes in meds. Will see pt back in 30-45 days fo visit 9.                                    "CONSENT"   YES     NO   Continuing further Investigational Product and study visits for follow-up? [x]  []   Continuing consent from future biomedical research [x]  []                                    "EVENTS"    YES     NO  AE   (IF YES SEE SOURCE) []  [x]   SAE  (IF YES SEE SOURCE) []  [x]   ENDPOINT   (IF YES SEE SOURCE) []  [x]   REVASCULARIZATION  (IF YES SEE SOURCE) []  [x]   AMPUTATION   (IF YES SEE SOURCE) []  [x]   TROPONIN'S  (IF YES SEE SOURCE) []  [x]    Lifestyle Adherence Assessment:    YES NO  Abstinence from smoking/remaining tobacco free X   Cardiac Diet X   Routine physical activity and/or cardiac rehabilitation X    EQ-5D-5L  MOBILITY:    I HAVE NO PROBLEMS WALKING []   I HAVE SLIGHT PROBLEMS WALKING []   I HAVE MODERATE PROBLEMS WALKING []   I HAVE SEVERE PROBLEMS WALKING []   I AM UNABLE TO WALK  []     SELF-CARE:   I HAVE NO PROBLEMS WASING OR DRESSING MYSELF  []   I HAVE SLIGHT PROBLEMS WASHING OR DRESSING MYSELF  []   I HAVE MODERATE PROBLEMS WASHING OR DRESSING MYSELF []   I HAVE SEVERE PROBLEMS WASHING OR DRESSING MYSELF  []   I HAVE SEVERE PROBLEMS WASHING OR DRESSING MYSELF  []   I AM UNABLE TO WASH OR DRESS MYSELF []     USUAL ACTIVITIES: (E.G. WORK/STUDY/HOUSEWORK/FAMILY OR LEISURE ACTIVITIES.    I HAVE NO PROBLEMS DOING MY USUAL ACTIVITIES []   I HAVE SLIGHT PROBLEMS DOING MY USUAL ACTIVITIES []   I HAVE MODERATE PROBLEMS DOING MY USUAL ACTIVIITIES []   I HAVE SEVERE PROBLEMS DOING MY USUAL ACTIVITIES []   I AM UNABLE TO DO MY USUAL ACTIVITIES []     PAIN /DISCOMFORT   I HAVE NO PAIN OR DISCOMFORT []   I HAVE SLIGHT PAIN OR DISCOMFORT []   I HAVE MODERATE PAIN OR DISCOMFORT []   I HAVE SEVERE PAIN OR DISCOMFORT []   I HAVE EXTREME PAIN OR DISCOMFORT []     ANXIETY/DEPRESSION   I AM NOT ANXIOUS OR DEPRESSED []    I AM SLIGHTLY ANXIOUS OR DEPRESSED []   I AM MODERATELY ANXIOUS OR DREPRESSED []   I AM SEVERELY ANXIOUS OR DEPRESSED []   I AM EXTREMELY ANXIOUS OR DEPRESSED []     SCALE OF 0-100 HOW WOULD YOU RATE TODAY?  0 IS THE WORSE AND 100 IS THE BEST HEALTH YOU CAN IMAGINE:

## 2018-07-01 NOTE — Research (Signed)
EQ-5D-5L  MOBILITY:    I HAVE NO PROBLEMS WALKING [x]   I HAVE SLIGHT PROBLEMS WALKING []   I HAVE MODERATE PROBLEMS WALKING []   I HAVE SEVERE PROBLEMS WALKING []   I AM UNABLE TO WALK  []     SELF-CARE:   I HAVE NO PROBLEMS WASING OR DRESSING MYSELF  [x]   I HAVE SLIGHT PROBLEMS WASHING OR DRESSING MYSELF  []   I HAVE MODERATE PROBLEMS WASHING OR DRESSING MYSELF []   I HAVE SEVERE PROBLEMS WASHING OR DRESSING90 MYSELF  []   I HAVE SEVERE PROBLEMS WASHING OR DRESSING MYSELF  []   I AM UNABLE TO WASH OR DRESS MYSELF []     USUAL ACTIVITIES: (E.G. WORK/STUDY/HOUSEWORK/FAMILY OR LEISURE ACTIVITIES.    I HAVE NO PROBLEMS DOING MY USUAL ACTIVITIES [x]   I HAVE SLIGHT PROBLEMS DOING MY USUAL ACTIVITIES []   I HAVE MODERATE PROBLEMS DOING MY USUAL ACTIVIITIES []   I HAVE SEVERE PROBLEMS DOING MY USUAL ACTIVITIES []   I AM UNABLE TO DO MY USUAL ACTIVITIES []     PAIN /DISCOMFORT   I HAVE NO PAIN OR DISCOMFORT [x]   I HAVE SLIGHT PAIN OR DISCOMFORT []   I HAVE MODERATE PAIN OR DISCOMFORT []   I HAVE SEVERE PAIN OR DISCOMFORT []   I HAVE EXTREME PAIN OR DISCOMFORT []     ANXIETY/DEPRESSION   I AM NOT ANXIOUS OR DEPRESSED [x]   I AM SLIGHTLY ANXIOUS OR DEPRESSED []   I AM MODERATELY ANXIOUS OR DREPRESSED []   I AM SEVERELY ANXIOUS OR DEPRESSED []   I AM EXTREMELY ANXIOUS OR DEPRESSED []     SCALE OF 0-100 HOW WOULD YOU RATE TODAY?  0 IS THE WORSE AND 100 IS THE BEST HEALTH YOU CAN IMAGINE: 90

## 2018-08-03 ENCOUNTER — Telehealth: Payer: Self-pay

## 2018-08-03 NOTE — Telephone Encounter (Signed)
Spoke with pt about rescheduling appt on 1/20 due to [facility] being out that day. Pt new appt is on 1/21 @ 8:45 am. Pt verbalized understanding.

## 2018-09-13 VITALS — BP 128/77 | HR 62 | Resp 18 | Wt 245.8 lb

## 2018-09-13 DIAGNOSIS — Z006 Encounter for examination for normal comparison and control in clinical research program: Secondary | ICD-10-CM

## 2018-09-13 NOTE — Patient Instructions (Addendum)
Pt was seen in clinic today for Research.   Mercer Pod  :) CV Research

## 2018-09-13 NOTE — Research (Signed)
Pt here today for visit 9, no complaints of cp or sob. No med changes.                                     "CONSENT"   YES     NO   Continuing further Investigational Product and study visits for follow-up? [x]  []   Continuing consent from future biomedical research [x]  []                                    "EVENTS"    YES     NO  AE   (IF YES SEE SOURCE) []  [x]   SAE  (IF YES SEE SOURCE) []  [x]   ENDPOINT   (IF YES SEE SOURCE) []  [x]   REVASCULARIZATION  (IF YES SEE SOURCE) []  [x]   AMPUTATION   (IF YES SEE SOURCE) []  [x]   TROPONIN'S  (IF YES SEE SOURCE) []  [x]    Lifestyle Adherence Assessment:    YES NO  Abstinence from smoking/remaining tobacco free X   Cardiac Diet X   Routine physical activity and/or cardiac rehabilitation X    EQ-5D-5L  MOBILITY:    I HAVE NO PROBLEMS WALKING [x]   I HAVE SLIGHT PROBLEMS WALKING []   I HAVE MODERATE PROBLEMS WALKING []   I HAVE SEVERE PROBLEMS WALKING []   I AM UNABLE TO WALK  []     SELF-CARE:   I HAVE NO PROBLEMS WASING OR DRESSING MYSELF  [x]   I HAVE SLIGHT PROBLEMS WASHING OR DRESSING MYSELF  []   I HAVE MODERATE PROBLEMS WASHING OR DRESSING MYSELF []   I HAVE SEVERE PROBLEMS WASHING OR DRESSING MYSELF  []   I HAVE SEVERE PROBLEMS WASHING OR DRESSING MYSELF  []   I AM UNABLE TO WASH OR DRESS MYSELF []     USUAL ACTIVITIES: (E.G. WORK/STUDY/HOUSEWORK/FAMILY OR LEISURE ACTIVITIES.    I HAVE NO PROBLEMS DOING MY USUAL ACTIVITIES [x]   I HAVE SLIGHT PROBLEMS DOING MY USUAL ACTIVITIES []   I HAVE MODERATE PROBLEMS DOING MY USUAL ACTIVIITIES []   I HAVE SEVERE PROBLEMS DOING MY USUAL ACTIVITIES []   I AM UNABLE TO DO MY USUAL ACTIVITIES []     PAIN /DISCOMFORT   I HAVE NO PAIN OR DISCOMFORT [x]   I HAVE SLIGHT PAIN OR DISCOMFORT []   I HAVE MODERATE PAIN OR DISCOMFORT []   I HAVE SEVERE PAIN OR DISCOMFORT []   I HAVE EXTREME PAIN OR DISCOMFORT []     ANXIETY/DEPRESSION   I AM NOT ANXIOUS OR DEPRESSED [x]   I AM SLIGHTLY ANXIOUS OR DEPRESSED []   I AM  MODERATELY ANXIOUS OR DREPRESSED []   I AM SEVERELY ANXIOUS OR DEPRESSED []   I AM EXTREMELY ANXIOUS OR DEPRESSED []     SCALE OF 0-100 HOW WOULD YOU RATE TODAY?  0 IS THE WORSE AND 100 IS THE BEST HEALTH YOU CAN IMAGINE: 90    Current Outpatient Medications:  .  aspirin EC 81 MG EC tablet, Take 1 tablet (81 mg total) by mouth daily., Disp: , Rfl:  .  atorvastatin (LIPITOR) 80 MG tablet, Take 1 tablet (80 mg total) by mouth daily at 6 PM., Disp: 90 tablet, Rfl: 2 .  metFORMIN (GLUCOPHAGE-XR) 500 MG 24 hr tablet, Take 500 mg by mouth at bedtime., Disp: , Rfl: 1 .  metoprolol tartrate (LOPRESSOR) 25 MG tablet, Take 0.5 tablets (12.5 mg total) by mouth 2 (two) times daily., Disp:  60 tablet, Rfl: 2 .  Multiple Vitamins-Minerals (ADULT ONE DAILY GUMMIES) CHEW, Chew 2 tablets by mouth daily. Centrum Gummies, Disp: , Rfl:  .  nitroGLYCERIN (NITROSTAT) 0.4 MG SL tablet, Place 1 tablet (0.4 mg total) under the tongue every 5 (five) minutes as needed., Disp: 25 tablet, Rfl: 2 .  ticagrelor (BRILINTA) 90 MG TABS tablet, Take 1 tablet (90 mg total) by mouth 2 (two) times daily., Disp: 180 tablet, Rfl: 2 .  triamcinolone cream (KENALOG) 0.1 %, APPLY CREAM EXTERNALLY TWICE DAILY, Disp: , Rfl: 2

## 2018-09-19 ENCOUNTER — Ambulatory Visit: Payer: 59 | Admitting: Physician Assistant

## 2018-09-19 DIAGNOSIS — I2510 Atherosclerotic heart disease of native coronary artery without angina pectoris: Secondary | ICD-10-CM | POA: Insufficient documentation

## 2018-09-19 NOTE — Progress Notes (Signed)
Cardiology Office Note:    Date:  09/20/2018   ID:  Danny Reid, DOB Mar 14, 1981, MRN [id number]  PCP:  Dorthula Matas, PA-C  Cardiologist:  Tonny Bollman, MD   Electrophysiologist:  None   Referring MD: Dorthula Matas, PA-C   Chief Complaint  Patient presents with  . Follow-up    CAD     History of Present Illness:    Danny Reid is a 38 y.o. male with coronary artery disease s/p non-ST elevation MI in 7/18 treated with multivessel PCI with DES, ischemic cardiomyopathy with EF 45-50, diabetes, hyperlipidemia.  He was last seen by Dr. Excell Seltzer in 05/2018.     Danny Reid returns for follow-up.  He is here alone.  Since last seen, he has been doing well.  He walks on a daily basis without exertional chest symptoms.  He has been eating a lot more salads recently and does have a lot of gas.  This tends to improve with Tums.  He denies significant shortness of breath.  He denies swelling or syncope.  Prior CV studies:   The following studies were reviewed today:  Cardiac Catheterization 03/07/18 LM mild diffuse dz LAD mild irregs RI mid 50 LCx ost 60, prox 90 RCA dist stent patent; RPDA ost stent patent PCI:  2.5 x 12 Resolute Onyx DES to ost LCx; 2.5 x 15 mm Resolute Onyx DES to prox LCx      Echo 03/07/18 Mild LVH, EF 45-50, inf-lat HK, trivial AI, mild MR  Cardiac Catheterization 03/06/18 LM mild diff dz LAD mild irregs RI 50 LCx ost 40, prox 90 RCA dist 100, RPDA ost 75 EF 35-45; inf AK PCI:  4 x 26 mm Resolute DES to distal RCA; 3 x 18 mm Resolute DES to ost RPDA  Past Medical History:  Diagnosis Date  . Acute MI (HCC) 03/05/2018   PCI to RCA and LCX  . Chickenpox   . Diabetes mellitus without complication (HCC)    borderline  . High cholesterol   . Hypertension    Surgical Hx: The patient  has a past surgical history that includes LEFT HEART CATH AND CORONARY ANGIOGRAPHY (N/A, 03/06/2018); CORONARY STENT INTERVENTION (N/A, 03/06/2018); Coronary/Graft Acute MI  Revascularization (N/A, 03/06/2018); and CORONARY STENT INTERVENTION (N/A, 03/07/2018).   Current Medications: Current Meds  Medication Sig  . aspirin EC 81 MG EC tablet Take 1 tablet (81 mg total) by mouth daily.  Marland Kitchen atorvastatin (LIPITOR) 80 MG tablet Take 1 tablet (80 mg total) by mouth daily at 6 PM.  . metFORMIN (GLUCOPHAGE-XR) 500 MG 24 hr tablet Take 500 mg by mouth at bedtime.  . metoprolol tartrate (LOPRESSOR) 25 MG tablet Take 0.5 tablets (12.5 mg total) by mouth 2 (two) times daily.  . Multiple Vitamins-Minerals (ADULT ONE DAILY GUMMIES) CHEW Chew 2 tablets by mouth daily. Centrum Gummies  . nitroGLYCERIN (NITROSTAT) 0.4 MG SL tablet Place 1 tablet (0.4 mg total) under the tongue every 5 (five) minutes as needed.  . ticagrelor (BRILINTA) 90 MG TABS tablet Take 1 tablet (90 mg total) by mouth 2 (two) times daily.  Marland Kitchen triamcinolone cream (KENALOG) 0.1 % Apply 1 application topically as needed (rash).      Allergies:   Patient has no known allergies.   Social History   Tobacco Use  . Smoking status: Former Smoker    Packs/day: 0.50    Years: 13.00    Pack years: 6.50    Types: Cigars    Last attempt to  quit: 03/07/2018    Years since quitting: 0.5  . Smokeless tobacco: Never Used  Substance Use Topics  . Alcohol use: Yes    Comment: social  . Drug use: No     Family Hx: The patient's family history includes Heart attack in his paternal grandfather; Hypertension in his mother; Multiple sclerosis in his paternal aunt.  ROS:   Please see the history of present illness.    ROS All other systems reviewed and are negative.   EKGs/Labs/Other Test Reviewed:    EKG:  EKG is ordered today.  The ekg ordered today demonstrates normal sinus rhythm, heart rate 70, inferior Q waves, anterolateral Q waves, T wave inversions 3, aVF, V5-V6, QTC 406, similar to prior tracing  Recent Labs: 03/06/2018: B Natriuretic Peptide 81.6 04/22/2018: ALT 23; BUN 9; Creatinine, Ser 0.92; Hemoglobin  14.1; Platelets 366; Potassium 4.3; Sodium 138   Recent Lipid Panel Lab Results  Component Value Date/Time   CHOL 92 (L) 04/22/2018 08:33 AM   TRIG 83 04/22/2018 08:33 AM   HDL 37 (L) 04/22/2018 08:33 AM   CHOLHDL 2.5 04/22/2018 08:33 AM   CHOLHDL 4.2 03/07/2018 02:33 AM   LDLCALC 38 04/22/2018 08:33 AM   LDLDIRECT 114.1 03/13/2013 08:38 AM    Physical Exam:    VS:  BP 122/80   Pulse 70   Ht 5\' 11"  (1.803 m)   Wt 254 lb 12.8 oz (115.6 kg)   SpO2 96%   BMI 35.54 kg/m     Wt Readings from Last 3 Encounters:  09/20/18 254 lb 12.8 oz (115.6 kg)  09/13/18 245 lb 12.8 oz (111.5 kg)  06/29/18 243 lb 9.6 oz (110.5 kg)     Physical Exam  Constitutional: He is oriented to person, place, and time. He appears well-developed and well-nourished. No distress.  HENT:  Head: Normocephalic and atraumatic.  Eyes: No scleral icterus.  Neck: Neck supple. No JVD present. No thyromegaly present.  Cardiovascular: Normal rate, regular rhythm, S1 normal and S2 normal.  No murmur heard. Pulmonary/Chest: Breath sounds normal. He has no rales.  Abdominal: Soft. There is no hepatomegaly.  Musculoskeletal:        General: No edema.  Lymphadenopathy:    He has no cervical adenopathy.  Neurological: He is alert and oriented to person, place, and time.  Skin: Skin is warm and dry.  Psychiatric: He has a normal mood and affect.    ASSESSMENT & PLAN:    Coronary artery disease involving native coronary artery of native heart without angina pectoris History of non-ST elevation myocardial infarction in July 2018 treated with DES to the distal RCA, DES to the ostial RPDA, DES x2 to the LCx.  He does have occasional gas.  Otherwise, he denies anginal symptoms.  Continue aspirin, Ticagrelor, atorvastatin, metoprolol.  FU in 6 mos with Dr. Excell Seltzer.    Ischemic cardiomyopathy EF 45-50 by echocardiogram July 2019.  He does not have any signs or symptoms of clinical heart failure.  Continue beta-blocker  therapy.  I did offer changing his metoprolol tartrate to metoprolol succinate.  He will let me know if he wants to do this.  I have recommended that he start ACE inhibitor therapy.    -Start lisinopril 2.5 mg daily.    -Obtain BMET in 2 weeks.  Essential hypertension  Blood pressure at target.  Add lisinopril as noted above for history of myocardial infarction and ischemic cardiomyopathy.  Pure hypercholesterolemia  LDL optimal on most recent lab work.  Continue current Rx.     Dispo:  Return in about 6 months (around 03/21/2019) for Routine Follow Up, w/ Dr. Excell Seltzer.   Medication Adjustments/Labs and Tests Ordered: Current medicines are reviewed at length with the patient today.  Concerns regarding medicines are outlined above.  Tests Ordered: Orders Placed This Encounter  Procedures  . Basic metabolic panel  . EKG 12-Lead   Medication Changes: Meds ordered this encounter  Medications  . lisinopril (PRINIVIL,ZESTRIL) 2.5 MG tablet    Sig: Take 1 tablet (2.5 mg total) by mouth daily.    Dispense:  90 tablet    Refill:  3    Signed, Tereso Newcomer, PA-C  09/20/2018 9:44 AM    [facility] [address] [place], [place], [place]  [address] Phone: [phone]; Fax: [phone]

## 2018-09-20 ENCOUNTER — Encounter: Payer: Self-pay | Admitting: Physician Assistant

## 2018-09-20 ENCOUNTER — Ambulatory Visit: Payer: 59 | Admitting: Physician Assistant

## 2018-09-20 VITALS — BP 122/80 | HR 70 | Ht 71.0 in | Wt 254.8 lb

## 2018-09-20 DIAGNOSIS — I255 Ischemic cardiomyopathy: Secondary | ICD-10-CM

## 2018-09-20 DIAGNOSIS — E7800 Pure hypercholesterolemia, unspecified: Secondary | ICD-10-CM

## 2018-09-20 DIAGNOSIS — I10 Essential (primary) hypertension: Secondary | ICD-10-CM

## 2018-09-20 DIAGNOSIS — I2510 Atherosclerotic heart disease of native coronary artery without angina pectoris: Secondary | ICD-10-CM

## 2018-09-20 MED ORDER — LISINOPRIL 2.5 MG PO TABS: 2.5000 mg | ORAL_TABLET | Freq: Every day | ORAL | 3 refills | Status: DC

## 2018-09-20 NOTE — Patient Instructions (Signed)
Medication Instructions:  Your physician has recommended you make the following change in your medication: 1. START LISINOPRIL 2.5 MG DAILY.   If you need a refill on your cardiac medications before your next appointment, please call your pharmacy.   Lab work: TO BE DONE IN 2 WEEKS: BMET  If you have labs (blood work) drawn today and your tests are completely normal, you will receive your results only by: Marland Kitchen MyChart Message (if you have MyChart) OR . A paper copy in the mail If you have any lab test that is abnormal or we need to change your treatment, we will call you to review the results.  Testing/Procedures: NONE  Follow-Up: At [facility], you and your health needs are our priority.  As part of our continuing mission to provide you with exceptional heart care, we have created designated Provider Care Teams.  These Care Teams include your primary Cardiologist (physician) and Advanced Practice Providers (APPs -  Physician Assistants and Nurse Practitioners) who all work together to provide you with the care you need, when you need it. You will need a follow up appointment in:  6 months.  Please call our office 2 months in advance to schedule this appointment.  You may see Tonny Bollman, MD or one of the following Advanced Practice Providers on your designated Care Team: Tereso Newcomer, PA-C [place], [place] . Berton Bon, NP  Any Other Special Instructions Will Be Listed Below (If Applicable).

## 2018-10-04 ENCOUNTER — Other Ambulatory Visit: Payer: 59

## 2018-10-04 DIAGNOSIS — I10 Essential (primary) hypertension: Secondary | ICD-10-CM

## 2018-10-04 DIAGNOSIS — E7800 Pure hypercholesterolemia, unspecified: Secondary | ICD-10-CM

## 2018-10-04 DIAGNOSIS — I2510 Atherosclerotic heart disease of native coronary artery without angina pectoris: Secondary | ICD-10-CM

## 2018-10-04 LAB — BASIC METABOLIC PANEL
BUN/Creatinine Ratio: 11 (ref 9–20)
BUN: 10 mg/dL (ref 6–20)
CALCIUM: 10 mg/dL (ref 8.7–10.2)
CO2: 24 mmol/L (ref 20–29)
Chloride: 99 mmol/L (ref 96–106)
Creatinine, Ser: 0.87 mg/dL (ref 0.76–1.27)
GFR, EST AFRICAN AMERICAN: 126 mL/min/{1.73_m2} (ref >59)
GFR, EST NON AFRICAN AMERICAN: 109 mL/min/{1.73_m2} (ref >59)
Glucose: 100 mg/dL — ABNORMAL HIGH (ref 65–99)
Potassium: 4.5 mmol/L (ref 3.5–5.2)
Sodium: 137 mmol/L (ref 134–144)

## 2018-11-03 DIAGNOSIS — Z0000 Encounter for general adult medical examination without abnormal findings: Secondary | ICD-10-CM | POA: Diagnosis not present

## 2018-11-03 DIAGNOSIS — E119 Type 2 diabetes mellitus without complications: Secondary | ICD-10-CM | POA: Diagnosis not present

## 2018-11-03 DIAGNOSIS — I10 Essential (primary) hypertension: Secondary | ICD-10-CM | POA: Diagnosis not present

## 2018-11-23 DIAGNOSIS — R319 Hematuria, unspecified: Secondary | ICD-10-CM | POA: Diagnosis not present

## 2018-11-23 DIAGNOSIS — N200 Calculus of kidney: Secondary | ICD-10-CM | POA: Diagnosis not present

## 2018-11-28 ENCOUNTER — Encounter: Payer: Self-pay | Admitting: *Deleted

## 2018-11-28 DIAGNOSIS — Z006 Encounter for examination for normal comparison and control in clinical research program: Secondary | ICD-10-CM

## 2018-11-28 NOTE — Research (Signed)
Pt doing well. No complaints of chest pains or shortness of breath. Saw cardiologist and they added Lisinopril to his daily regimen.                                     "CONSENT"   YES     NO   Continuing further Investigational Product and study visits for follow-up? [x]  []   Continuing consent from future biomedical research [x]  []                                      "EVENTS"    YES     NO  AE   (IF YES SEE SOURCE) []  [x]   SAE  (IF YES SEE SOURCE) []  [x]   ENDPOINT   (IF YES SEE SOURCE) []  [x]   REVASCULARIZATION  (IF YES SEE SOURCE) []  [x]   AMPUTATION   (IF YES SEE SOURCE) []  [x]   TROPONIN'S  (IF YES SEE SOURCE) []  [x]     Current Outpatient Medications:  .  aspirin EC 81 MG EC tablet, Take 1 tablet (81 mg total) by mouth daily., Disp: , Rfl:  .  atorvastatin (LIPITOR) 80 MG tablet, Take 1 tablet (80 mg total) by mouth daily at 6 PM., Disp: 90 tablet, Rfl: 2 .  lisinopril (PRINIVIL,ZESTRIL) 2.5 MG tablet, Take 1 tablet (2.5 mg total) by mouth daily., Disp: 90 tablet, Rfl: 3 .  metFORMIN (GLUCOPHAGE-XR) 500 MG 24 hr tablet, Take 500 mg by mouth at bedtime., Disp: , Rfl: 1 .  metoprolol tartrate (LOPRESSOR) 25 MG tablet, Take 0.5 tablets (12.5 mg total) by mouth 2 (two) times daily., Disp: 60 tablet, Rfl: 2 .  Multiple Vitamins-Minerals (ADULT ONE DAILY GUMMIES) CHEW, Chew 2 tablets by mouth daily. Centrum Gummies, Disp: , Rfl:  .  nitroGLYCERIN (NITROSTAT) 0.4 MG SL tablet, Place 1 tablet (0.4 mg total) under the tongue every 5 (five) minutes as needed., Disp: 25 tablet, Rfl: 2 .  ticagrelor (BRILINTA) 90 MG TABS tablet, Take 1 tablet (90 mg total) by mouth 2 (two) times daily., Disp: 180 tablet, Rfl: 2 .  triamcinolone cream (KENALOG) 0.1 %, Apply 1 application topically as needed (rash). , Disp: , Rfl: 2

## 2018-12-12 DIAGNOSIS — R319 Hematuria, unspecified: Secondary | ICD-10-CM | POA: Diagnosis not present

## 2018-12-12 DIAGNOSIS — N200 Calculus of kidney: Secondary | ICD-10-CM | POA: Diagnosis not present

## 2018-12-14 DIAGNOSIS — R319 Hematuria, unspecified: Secondary | ICD-10-CM | POA: Diagnosis not present

## 2018-12-14 DIAGNOSIS — N200 Calculus of kidney: Secondary | ICD-10-CM | POA: Diagnosis not present

## 2018-12-20 DIAGNOSIS — N200 Calculus of kidney: Secondary | ICD-10-CM | POA: Diagnosis not present

## 2019-03-24 ENCOUNTER — Encounter: Payer: Self-pay | Admitting: *Deleted

## 2019-03-24 DIAGNOSIS — Z006 Encounter for examination for normal comparison and control in clinical research program: Secondary | ICD-10-CM

## 2019-03-29 NOTE — Research (Signed)
[facility] V11  Visit completed by phone due to CoViD pandemic.  Pt doing well, no complaints of cp or sob.  No med changes as noted by patient. Thanked him for participating in the study.    Lifestyle Adherence Assessment:   YES NO  Abstinence from smoking/remaining tobacco free X   Cardiac Diet X   Routine physical activity and/or cardiac rehabilitation X                                     "CONSENT"   YES     NO   Continuing further Investigational Product and study visits for follow-up? [x]  []   Continuing consent from future biomedical research [x]  []                                    "EVENTS"    YES     NO  AE   (IF YES SEE SOURCE) []  [x]   SAE  (IF YES SEE SOURCE) []  [x]   ENDPOINT   (IF YES SEE SOURCE) []  [x]   REVASCULARIZATION  (IF YES SEE SOURCE) []  [x]   AMPUTATION   (IF YES SEE SOURCE) []  [x]   TROPONIN'S  (IF YES SEE SOURCE) []  [x]    Current Outpatient Medications  Medication Instructions  . aspirin 81 mg, Oral, Daily  . atorvastatin (LIPITOR) 80 mg, Oral, Daily-1800  . lisinopril (ZESTRIL) 2.5 mg, Oral, Daily  . metFORMIN (GLUCOPHAGE-XR) 500 mg, Oral, Daily at bedtime  . metoprolol tartrate (LOPRESSOR) 12.5 mg, Oral, 2 times daily  . Multiple Vitamins-Minerals (ADULT ONE DAILY GUMMIES) CHEW 2 tablets, Oral, Daily, Centrum Gummies  . nitroGLYCERIN (NITROSTAT) 0.4 mg, Sublingual, Every 5 min PRN  . ticagrelor (BRILINTA) 90 mg, Oral, 2 times daily  . triamcinolone cream (KENALOG) 0.1 % 1 application, Topical, As needed

## 2019-04-20 NOTE — Progress Notes (Signed)
Cardiology Office Note:    Date:  04/21/2019   ID:  Danny Reid, DOB 02/11/1981, MRN [id number]  PCP:  Dorthula MatasJudd, Danny Reid  Cardiologist:  Danny BollmanMichael Cooper, MD  Electrophysiologist:  None   Referring MD: Dorthula MatasJudd, Danny Reid   Chief Complaint  Patient presents with   Follow-up    CAD    History of Present Illness:    Danny Reid is a 38 y.o. male with:  Coronary artery disease   S/p NSTEMI in 7/19 >> PCI: DES to RCA and DES to RPDA; staged PCI w/ DES to LCx x 2  Ischemic CM EF 45-50  Diabetes mellitus 2   Hyperlipidemia   Danny Reid was last seen in 08/2018.  He returns for follow-up.  He is doing well without chest discomfort, shortness of breath, syncope.  He has not had orthopnea or significant leg swelling.  He has not had bleeding issues.  Prior CV studies:   The following studies were reviewed today:   Cardiac Catheterization 03/07/18 LM mild diffuse dz LAD mild irregs RI mid 50 LCx ost 60, prox 90 RCA dist stent patent; RPDA ost stent patent PCI:  2.5 x 12 Resolute Onyx DES to ost LCx; 2.5 x 15 mm Resolute Onyx DES to prox LCx        Echo 03/07/18 Mild LVH, EF 45-50, inf-lat HK, trivial AI, mild MR   Cardiac Catheterization 03/06/18 LM mild diff dz LAD mild irregs RI 50 LCx ost 40, prox 90 RCA dist 100, RPDA ost 75 EF 35-45; inf AK PCI:  4 x 26 mm Resolute DES to distal RCA; 3 x 18 mm Resolute DES to ost RPDA  Past Medical History:  Diagnosis Date   Acute MI (HCC) 03/05/2018   PCI to RCA and LCX   Chickenpox    Diabetes mellitus without complication (HCC)    borderline   High cholesterol    Hypertension    Surgical Hx: The patient  has a past surgical history that includes LEFT HEART CATH AND CORONARY ANGIOGRAPHY (N/A, 03/06/2018); CORONARY STENT INTERVENTION (N/A, 03/06/2018); Coronary/Graft Acute MI Revascularization (N/A, 03/06/2018); and CORONARY STENT INTERVENTION (N/A, 03/07/2018).   Current Medications: Current Meds  Medication Sig     aspirin EC 81 MG EC tablet Take 1 tablet (81 mg total) by mouth daily.   atorvastatin (LIPITOR) 80 MG tablet Take 1 tablet (80 mg total) by mouth daily at 6 PM.   metFORMIN (GLUCOPHAGE-XR) 500 MG 24 hr tablet Take 500 mg by mouth at bedtime.   Multiple Vitamins-Minerals (ADULT ONE DAILY GUMMIES) CHEW Chew 2 tablets by mouth daily. Centrum Gummies   nitroGLYCERIN (NITROSTAT) 0.4 MG SL tablet Place 1 tablet (0.4 mg total) under the tongue every 5 (five) minutes as needed.   triamcinolone cream (KENALOG) 0.1 % Apply 1 application topically as needed (rash).    [DISCONTINUED] metoprolol tartrate (LOPRESSOR) 25 MG tablet Take 0.5 tablets (12.5 mg total) by mouth 2 (two) times daily.   [DISCONTINUED] ticagrelor (BRILINTA) 90 MG TABS tablet Take 1 tablet (90 mg total) by mouth 2 (two) times daily.     Allergies:   Patient has no known allergies.   Social History   Tobacco Use   Smoking status: Former Smoker    Packs/day: 0.50    Years: 13.00    Pack years: 6.50    Types: Cigars    Quit date: 03/07/2018    Years since quitting: 1.1   Smokeless tobacco: Never Used  Substance Use  Topics   Alcohol use: Yes    Comment: social   Drug use: No     Family Hx: The patient's family history includes Heart attack in his paternal grandfather; Hypertension in his mother; Multiple sclerosis in his paternal aunt.  ROS:   Please see the history of present illness.    ROS All other systems reviewed and are negative.   EKGs/Labs/Other Test Reviewed:    EKG:  EKG is ordered today.  The ekg ordered today demonstrates normal sinus rhythm, heart rate 67, inferior Q waves with T wave inversions in 3 and aVF, lateral Q waves, QTC 416, similar to prior tracing   Recent Labs: 04/22/2018: ALT 23; Hemoglobin 14.1; Platelets 366 10/04/2018: BUN 10; Creatinine, Ser 0.87; Potassium 4.5; Sodium 137   Recent Lipid Panel Lab Results  Component Value Date/Time   CHOL 92 (L) 04/22/2018 08:33 AM   TRIG  83 04/22/2018 08:33 AM   HDL 37 (L) 04/22/2018 08:33 AM   CHOLHDL 2.5 04/22/2018 08:33 AM   CHOLHDL 4.2 03/07/2018 02:33 AM   LDLCALC 38 04/22/2018 08:33 AM   LDLDIRECT 114.1 03/13/2013 08:38 AM       Physical Exam:    VS:  BP 132/84    Pulse 67    Ht 5\' 11"  (1.803 m)    Wt 256 lb (116.1 kg)    BMI 35.70 kg/m     Wt Readings from Last 3 Encounters:  04/21/19 256 lb (116.1 kg)  09/20/18 254 lb 12.8 oz (115.6 kg)  09/13/18 245 lb 12.8 oz (111.5 kg)     Physical Exam  Constitutional: He is oriented to person, place, and time. He appears well-developed and well-nourished. No distress.  HENT:  Head: Normocephalic and atraumatic.  Eyes: No scleral icterus.  Neck: No JVD present. No thyromegaly present.  Cardiovascular: Normal rate, regular rhythm and normal heart sounds.  No murmur heard. Pulmonary/Chest: Effort normal and breath sounds normal. He has no rales.  Abdominal: Soft. There is no hepatomegaly.  Musculoskeletal:        General: No edema.  Lymphadenopathy:    He has no cervical adenopathy.  Neurological: He is alert and oriented to person, place, and time.  Skin: Skin is warm and dry.  Psychiatric: He has a normal mood and affect.    ASSESSMENT & PLAN:    1. Coronary artery disease involving native coronary artery of native heart without angina pectoris History of non-ST elevation myocardial infarction in July 2019 treated with a drug-eluting stent to the RCA, drug-eluting stent to the RPDA and drug-eluting stent x2 to the LCx.  He is doing well without anginal symptoms.  With multiple stents, I think he should remain on dual antiplatelet therapy.  We discussed the possibility of changing Brilinta to Plavix now that he is 1 year out from his myocardial infarction.  He prefers to remain on Brilinta.  As he is 1 year out, I will change his dose to 60 mg twice daily.  Continue aspirin, statin.  2. Ischemic cardiomyopathy EF 45-50 by echocardiogram July 2019.  He is not  having any symptoms to suggest volume excess.  Continue beta-blocker, ACE inhibitor.  He prefers to change his beta-blocker to once daily dosing.  Therefore, stop metoprolol tartrate and start metoprolol succinate 25 mg daily.  3. Essential hypertension The patient's blood pressure is controlled on his current regimen.  Continue current therapy.   4. Pure hypercholesterolemia LDL optimal on most recent lab work.  Continue current Rx.  5. Type 2 diabetes mellitus with complication, without long-term current use of insulin (HCC) His metformin was changed recently due to nationwide recalls.  He had side effects related to the short acting formulation. He is back on the extended release now without side effects.  Continue follow-up with primary care.   Dispo:  Return in about 6 months (around 10/22/2019) for Routine Follow Up, w/ Dr. Excell Seltzer, or Tereso Newcomer, Reid.   Medication Adjustments/Labs and Tests Ordered: Current medicines are reviewed at length with the patient today.  Concerns regarding medicines are outlined above.  Tests Ordered: Orders Placed This Encounter  Procedures   EKG 12-Lead   Medication Changes: Meds ordered this encounter  Medications   metoprolol succinate (TOPROL XL) 25 MG 24 hr tablet    Sig: Take 1 tablet (25 mg total) by mouth daily.    Dispense:  30 tablet    Refill:  11    Metoprolol tartrate was discontinued    Order Specific Question:   Supervising Provider    Answer:   Lewayne Bunting [[id number]]   ticagrelor (BRILINTA) 60 MG TABS tablet    Sig: Take 1 tablet (60 mg total) by mouth 2 (two) times daily.    Dispense:  60 tablet    Refill:  11    Dose decrease    Order Specific Question:   Supervising Provider    Answer:   Lewayne Bunting [[id number]]    Signed, Tereso Newcomer, Reid  04/21/2019 1:35 PM    [facility] [address] [place], [place], [place]  [address] Phone: [phone]; Fax: [phone]

## 2019-04-21 ENCOUNTER — Ambulatory Visit: Payer: 59 | Admitting: Physician Assistant

## 2019-04-21 ENCOUNTER — Other Ambulatory Visit: Payer: Self-pay

## 2019-04-21 ENCOUNTER — Encounter: Payer: Self-pay | Admitting: Physician Assistant

## 2019-04-21 VITALS — BP 132/84 | HR 67 | Ht 71.0 in | Wt 256.0 lb

## 2019-04-21 DIAGNOSIS — I10 Essential (primary) hypertension: Secondary | ICD-10-CM | POA: Diagnosis not present

## 2019-04-21 DIAGNOSIS — E118 Type 2 diabetes mellitus with unspecified complications: Secondary | ICD-10-CM

## 2019-04-21 DIAGNOSIS — E7800 Pure hypercholesterolemia, unspecified: Secondary | ICD-10-CM

## 2019-04-21 DIAGNOSIS — I2510 Atherosclerotic heart disease of native coronary artery without angina pectoris: Secondary | ICD-10-CM | POA: Diagnosis not present

## 2019-04-21 DIAGNOSIS — I255 Ischemic cardiomyopathy: Secondary | ICD-10-CM | POA: Diagnosis not present

## 2019-04-21 MED ORDER — METOPROLOL SUCCINATE ER 25 MG PO TB24: 25.0000 mg | ORAL_TABLET | Freq: Every day | ORAL | 11 refills | Status: DC

## 2019-04-21 MED ORDER — TICAGRELOR 60 MG PO TABS: 60.0000 mg | ORAL_TABLET | Freq: Two times a day (BID) | ORAL | 11 refills | Status: AC

## 2019-04-21 NOTE — Patient Instructions (Signed)
Medication Instructions:  Stop Metoprolol Tartrate  Start Metoprolol Succinate (Toprol-XL) 25 mg once daily   Finish your current bottle of Brilinta 90 mg and stop. Then, start Brilinta 60 mg twice daily   If you need a refill on your cardiac medications before your next appointment, please call your pharmacy.   Lab work: None   If you have labs (blood work) drawn today and your tests are completely normal, you will receive your results only by: Marland Kitchen MyChart Message (if you have MyChart) OR . A paper copy in the mail If you have any lab test that is abnormal or we need to change your treatment, we will call you to review the results.  Testing/Procedures: None   Follow-Up: At [facility], you and your health needs are our priority.  As part of our continuing mission to provide you with exceptional heart care, we have created designated Provider Care Teams.  These Care Teams include your primary Cardiologist (physician) and Advanced Practice Providers (APPs -  Physician Assistants and Nurse Practitioners) who all work together to provide you with the care you need, when you need it. You will need a follow up appointment in:  6 months.  Please call our office 2 months in advance to schedule this appointment.  You may see Sherren Mocha, MD or Richardson Dopp, PA-C   Any Other Special Instructions Will Be Listed Below (If Applicable).

## 2019-07-24 ENCOUNTER — Other Ambulatory Visit: Payer: Self-pay | Admitting: Physician Assistant

## 2019-08-04 ENCOUNTER — Other Ambulatory Visit: Payer: Self-pay

## 2019-08-04 DIAGNOSIS — Z20822 Contact with and (suspected) exposure to covid-19: Secondary | ICD-10-CM

## 2019-08-07 LAB — NOVEL CORONAVIRUS, NAA: SARS-CoV-2, NAA: NOT DETECTED

## 2019-08-15 IMAGING — DX DG CHEST 2V
2 series · 2 of 2 positions shown · non-contrast
Comparison: None.

CLINICAL DATA: Central chest pain since yesterday. Nausea and
diaphoresis.

EXAM:
CHEST - 2 VIEW

[chest pa]
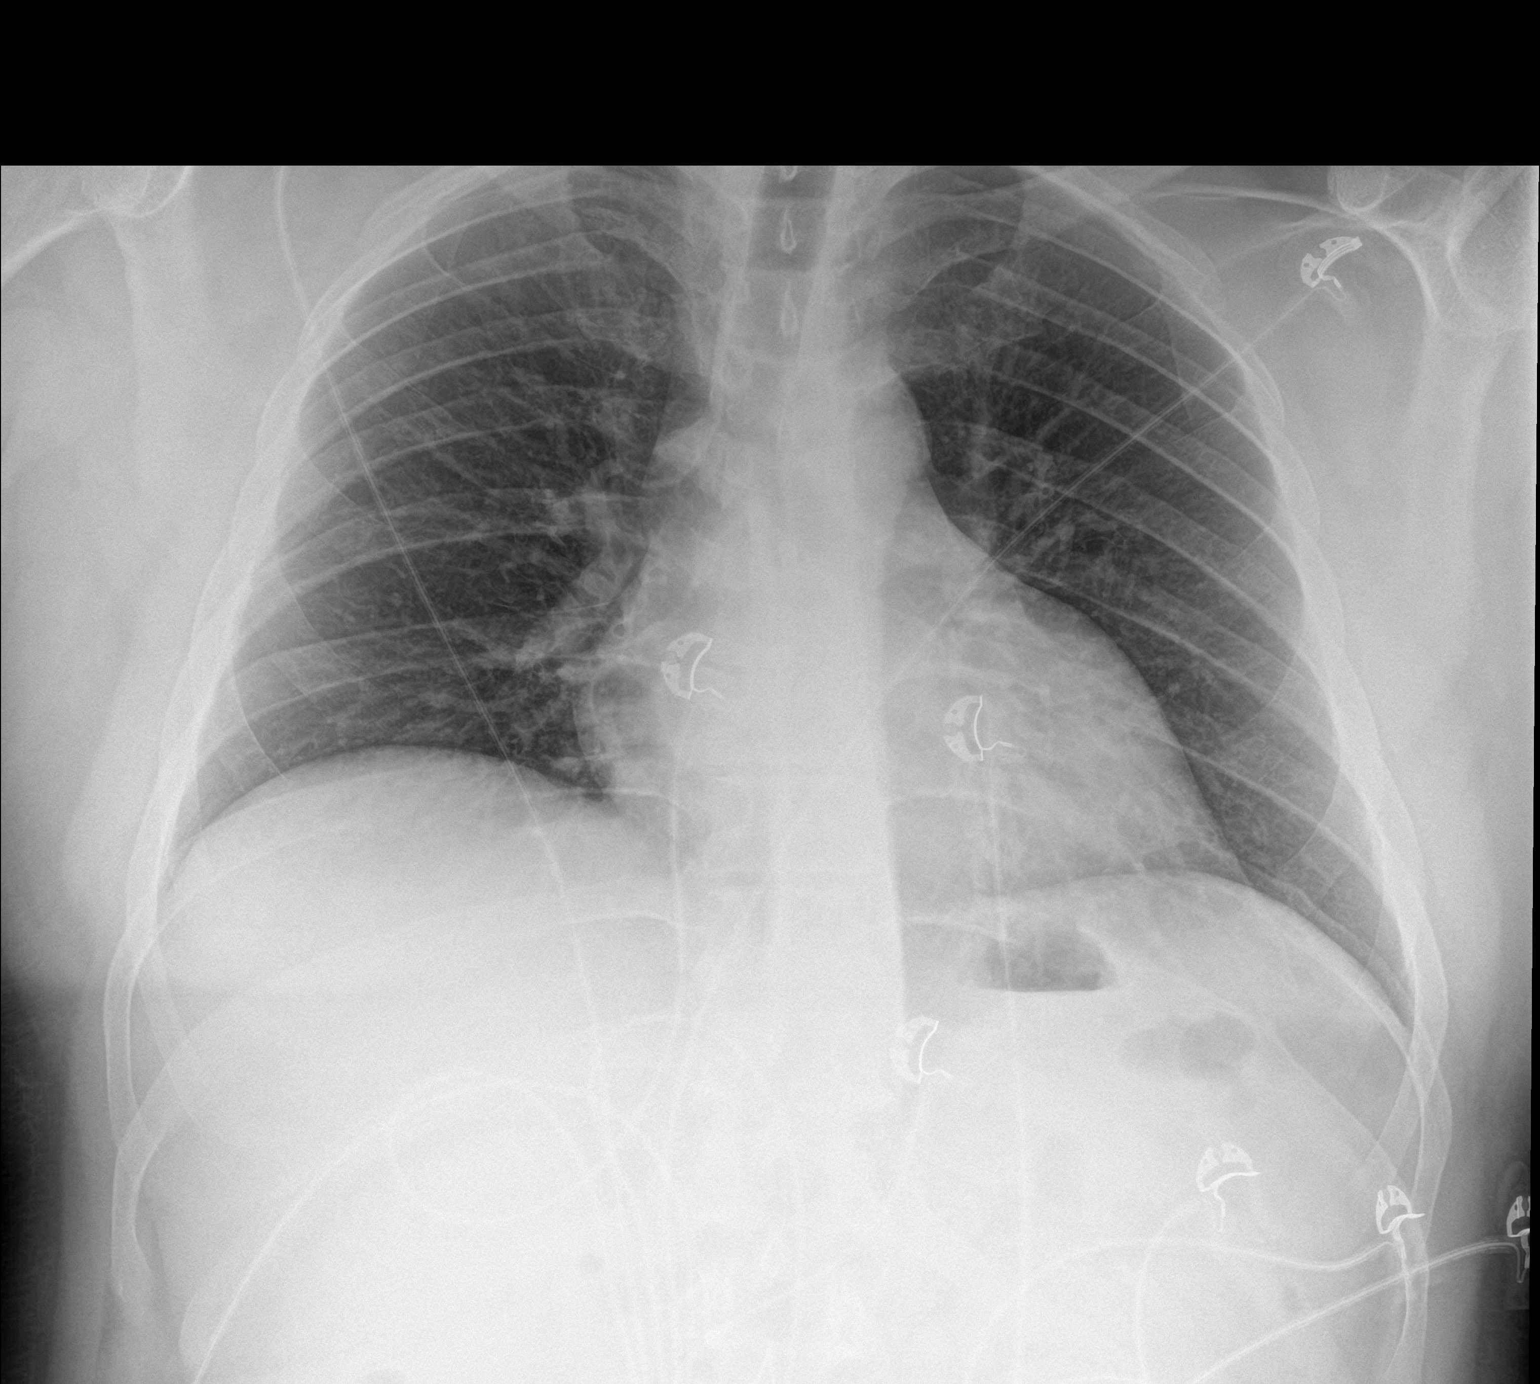

[chest lat]
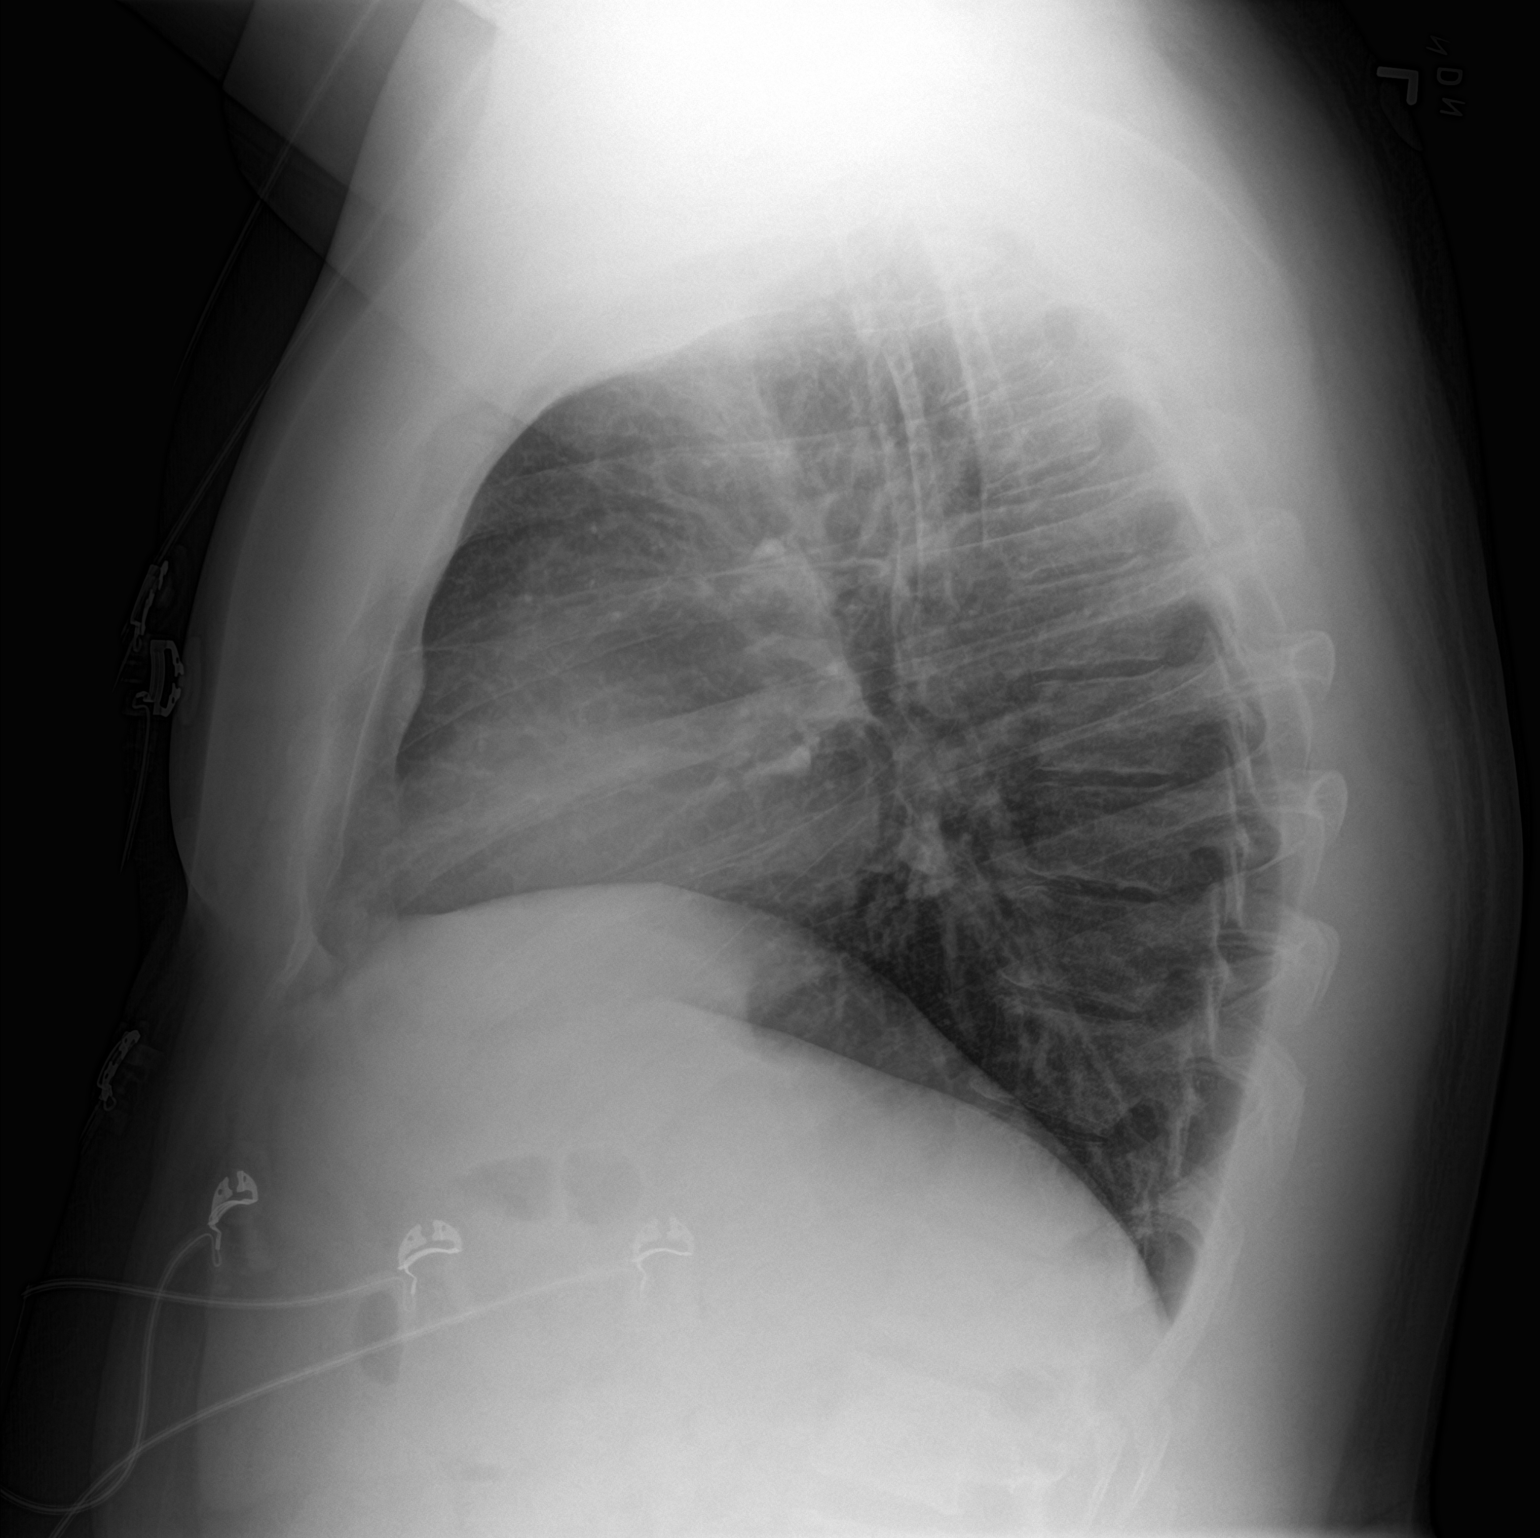

[2 of 2 positions shown; findings below may reference images not displayed]

FINDINGS: The heart size and mediastinal contours are within normal limits.
Both lungs are clear. The visualized skeletal structures are
unremarkable.
IMPRESSION: No active cardiopulmonary disease.

## 2020-06-21 ENCOUNTER — Encounter: Payer: Self-pay | Admitting: Physician Assistant

## 2020-07-03 ENCOUNTER — Telehealth: Payer: 59 | Admitting: Physician Assistant

## 2020-07-18 NOTE — Progress Notes (Signed)
Virtual Visit via Telephone Note   This visit type was conducted due to national recommendations for restrictions regarding the COVID-19 Pandemic (e.g. social distancing) in an effort to limit this patient's exposure and mitigate transmission in our community.  Due to his co-morbid illnesses, this patient is at least at moderate risk for complications without adequate follow up.  This format is felt to be most appropriate for this patient at this time.  The patient did not have access to video technology/had technical difficulties with video requiring transitioning to audio format only (telephone).  All issues noted in this document were discussed and addressed.  No physical exam could be performed with this format.  Please refer to the patient's chart for his  consent to telehealth for [facility].    Date:  07/19/2020   ID:  Danny Reid, DOB [birth year], MRN [id number] The patient was identified using 2 identifiers.  Patient Location: Home Provider Location: Home Office  PCP:  Drosinis, Leonia Reader, PA-C  Cardiologist:  Tonny Bollman, MD   Electrophysiologist:  None   Evaluation Performed:  Follow-Up Visit  Chief Complaint:  Follow-up (CAD)    Patient Profile: Danny Reid is a 39 y.o. male with:  Coronary artery disease  ? S/p NSTEMI in 7/19 >> PCI: DES to RCA and DES to RPDA; staged PCI w/ DES to LCx x 2  Ischemic CM EF 45-50  Diabetes mellitus 2   Hyperlipidemia   Prior CV Studies: Cardiac Catheterization7/8/19 LM mild diffuse dz LAD mild irregs RI mid 50 LCx ost 60, prox 90 RCA dist stent patent; RPDA ost stent patent PCI: 2.5 x 12 Resolute Onyx DES to ost LCx; 2.5 x 15 mm Resolute Onyx DES to prox LCx    Echo 03/07/18 Mild LVH, EF 45-50, inf-lat HK, trivial AI, mild MR  Cardiac Catheterization7/7/19 LM mild diff dz LAD mild irregs RI 50 LCx ost 40, prox 90 RCA dist 100, RPDA ost 75 EF 35-45; inf AK PCI: 4 x 26 mm Resolute DES to distal RCA; 3  x 18 mm Resolute DES to ost RPDA   History of Present Illness:   Danny Reid was last seen in August 2020.  He is seen for follow-up.  Overall, he has been doing well.  He has not had chest discomfort reminiscent of his previous angina.  He does have some shortness of breath with certain activities.  He has been more sedentary over the past year.  He feels that he is out of shape.  He has not had severe shortness of breath.  He has not had orthopnea, leg swelling, syncope.  He does snore.  He has some mild fatigue at times.   Past Medical History:  Diagnosis Date  . Acute MI (HCC) 03/05/2018   PCI to RCA and LCX  . Chickenpox   . Diabetes mellitus without complication (HCC)    borderline  . High cholesterol   . Hypertension    Past Surgical History:  Procedure Laterality Date  . CORONARY STENT INTERVENTION N/A 03/06/2018   Procedure: CORONARY STENT INTERVENTION;  Surgeon: Tonny Bollman, MD;  Location: [facility] INVASIVE CV LAB;  Service: Cardiovascular;  Laterality: N/A;  . CORONARY STENT INTERVENTION N/A 03/07/2018   Procedure: CORONARY STENT INTERVENTION;  Surgeon: Yvonne Kendall, MD;  Location: [facility] INVASIVE CV LAB;  Service: Cardiovascular;  Laterality: N/A;  CFX X 2 STENTS  . CORONARY/GRAFT ACUTE MI REVASCULARIZATION N/A 03/06/2018   Procedure: Coronary/Graft Acute MI Revascularization;  Surgeon: Tonny Bollman, MD;  Location: [facility] INVASIVE CV LAB;  Service: Cardiovascular;  Laterality: N/A;  . LEFT HEART CATH AND CORONARY ANGIOGRAPHY N/A 03/06/2018   Procedure: LEFT HEART CATH AND CORONARY ANGIOGRAPHY;  Surgeon: Tonny Bollman, MD;  Location: [facility] INVASIVE CV LAB;  Service: Cardiovascular;  Laterality: N/A;     Current Meds  Medication Sig  . aspirin EC 81 MG EC tablet Take 1 tablet (81 mg total) by mouth daily.  Marland Kitchen atorvastatin (LIPITOR) 80 MG tablet Take 1 tablet (80 mg total) by mouth daily at 6 PM.  . BRILINTA 60 MG TABS tablet Take 60 mg by mouth 2 (two) times daily.  Marland Kitchen lisinopril  (ZESTRIL) 2.5 MG tablet Take 1 tablet by mouth once daily  . metFORMIN (GLUCOPHAGE-XR) 500 MG 24 hr tablet Take 500 mg by mouth at bedtime.  . metoprolol succinate (TOPROL XL) 25 MG 24 hr tablet Take 1 tablet (25 mg total) by mouth daily.  . Multiple Vitamins-Minerals (ADULT ONE DAILY GUMMIES) CHEW Chew 2 tablets by mouth daily. Centrum Gummies  . nitroGLYCERIN (NITROSTAT) 0.4 MG SL tablet Place 1 tablet (0.4 mg total) under the tongue every 5 (five) minutes as needed.  . Omega-3 Fatty Acids (FISH OIL) 875 MG CAPS Take 1 capsule by mouth in the morning and at bedtime.  . triamcinolone cream (KENALOG) 0.1 % Apply 1 application topically as needed (rash).      Allergies:   Patient has no known allergies.   Social History   Tobacco Use  . Smoking status: Former Smoker    Packs/day: 0.50    Years: 13.00    Pack years: 6.50    Types: Cigars    Quit date: 03/07/2018    Years since quitting: 2.3  . Smokeless tobacco: Never Used  Vaping Use  . Vaping Use: Never used  Substance Use Topics  . Alcohol use: Yes    Comment: social  . Drug use: No     Family Hx: The patient's family history includes Heart attack in his paternal grandfather; Hypertension in his mother; Multiple sclerosis in his paternal aunt.  ROS:   Please see the history of present illness.       Labs/Other Tests and Data Reviewed:    EKG:  No ECG reviewed.  Recent Labs: No results found for requested labs within last 8760 hours.   Recent Lipid Panel Lab Results  Component Value Date/Time   CHOL 92 (L) 04/22/2018 08:33 AM   TRIG 83 04/22/2018 08:33 AM   HDL 37 (L) 04/22/2018 08:33 AM   CHOLHDL 2.5 04/22/2018 08:33 AM   CHOLHDL 4.2 03/07/2018 02:33 AM   LDLCALC 38 04/22/2018 08:33 AM   LDLDIRECT 114.1 03/13/2013 08:38 AM   Labs from Care Everywhere personally reviewed and interpreted 05/10/2020: K+ 4.1, creatinine 0.85, ALT 54, triglycerides 160, HDL 49, LDL 47, hemoglobin 14.8  Wt Readings from Last 3  Encounters:  07/19/20 256 lb (116.1 kg)  04/21/19 256 lb (116.1 kg)  09/20/18 254 lb 12.8 oz (115.6 kg)     Risk Assessment/Calculations:      Objective:    Vital Signs:  Ht 5\' 11"  (1.803 m)   Wt 256 lb (116.1 kg)   BMI 35.70 kg/m    VITAL SIGNS:  reviewed GEN:  no acute distress PSYCH:  normal affect  ASSESSMENT & PLAN:    1. Coronary artery disease involving native coronary artery of native heart without angina pectoris S/p non-STEMI in 7/19 treated with DES to the RCA, DES to the RPDA  and DES x2 to the LCx.  He is doing well without anginal symptoms.  Given his multiple stents and history of diabetes, I think it be best to continue him on dual antiplatelet therapy for at least 3 years post MI.  We can certainly consider switching him to a single antiplatelet agent after that time.  He notes that his pharmacy recently switched his Brilinta to clopidogrel.  I asked him to contact his pharmacy to see if this was related to his insurance.  I explained to him that it is acceptable for him to take either the low-dose Brilinta or clopidogrel.  Continue atorvastatin, lisinopril, metoprolol succinate.  Follow-up 6 months in person with an EKG.  2. Ischemic cardiomyopathy EF 45.  He does not have obvious symptoms of volume overload.  Continue beta-blocker, ACE inhibitor.  3. Essential hypertension Blood pressure not obtained today.  I reviewed notes from his PCP through care everywhere.  His most recent blood pressure was optimal.  Continue current therapy.  4. Pure hypercholesterolemia Recent LDL optimal.  Triglycerides above goal.  We discussed the importance of limiting simple carbohydrates to improve his triglyceride level.  If his triglycerides remain consistently above 150, we could consider placing him on Vascepa.  5.  Diabetes mellitus Managed by primary care.  Empagliflozin could be considered given the results of the EMPA-REG OUTCOME trial.  I asked him to discuss this with his  primary care provider.       Time:   Today, I have spent 23 minutes with the patient with telehealth technology discussing the above problems.     Medication Adjustments/Labs and Tests Ordered: Current medicines are reviewed at length with the patient today.  Concerns regarding medicines are outlined above.   Tests Ordered: Orders Placed This Encounter  Procedures  . Home sleep test    Medication Changes: No orders of the defined types were placed in this encounter.   Follow Up:  In Person in 6 month(s)  Signed, Tereso Newcomer, PA-C  07/19/2020 9:38 AM    [facility]

## 2020-07-19 ENCOUNTER — Other Ambulatory Visit: Payer: Self-pay

## 2020-07-19 ENCOUNTER — Telehealth (INDEPENDENT_AMBULATORY_CARE_PROVIDER_SITE_OTHER): Payer: 59 | Admitting: Physician Assistant

## 2020-07-19 ENCOUNTER — Encounter: Payer: Self-pay | Admitting: Physician Assistant

## 2020-07-19 VITALS — Ht 71.0 in | Wt 256.0 lb

## 2020-07-19 DIAGNOSIS — E7800 Pure hypercholesterolemia, unspecified: Secondary | ICD-10-CM

## 2020-07-19 DIAGNOSIS — I255 Ischemic cardiomyopathy: Secondary | ICD-10-CM

## 2020-07-19 DIAGNOSIS — I10 Essential (primary) hypertension: Secondary | ICD-10-CM | POA: Diagnosis not present

## 2020-07-19 DIAGNOSIS — I2510 Atherosclerotic heart disease of native coronary artery without angina pectoris: Secondary | ICD-10-CM

## 2020-07-19 DIAGNOSIS — R0683 Snoring: Secondary | ICD-10-CM

## 2020-07-19 NOTE — Patient Instructions (Signed)
Medication Instructions:   Ask your Primary Care Provider about Empagliflozin [place]) for diabetes.  This is the medication we talked about that has a lot of benefits for heart patients who have diabetes.  Call your pharmacy to find out why your Brilinta was changed to clopidogrel (Plavix).  If it was an insurance thing and you have to remain on clopidogrel, contact us so we can update your medication list.  *If you need a refill on your cardiac medications before your next appointment, please call your pharmacy*   Testing/Procedures: Home sleep study   Follow-Up: At [facility], you and your health needs are our priority.  As part of our continuing mission to provide you with exceptional heart care, we have created designated Provider Care Teams.  These Care Teams include your primary Cardiologist (physician) and Advanced Practice Providers (APPs -  Physician Assistants and Nurse Practitioners) who all work together to provide you with the care you need, when you need it.  We recommend signing up for the patient portal called "MyChart".  Sign up information is provided on this After Visit Summary.  MyChart is used to connect with patients for Virtual Visits (Telemedicine).  Patients are able to view lab/test results, encounter notes, upcoming appointments, etc.  Non-urgent messages can be sent to your provider as well.   To learn more about what you can do with MyChart, go to [email or web address].    Your next appointment:   6 month(s)  The format for your next appointment:   In Person  Provider:   Tonny Bollman, MD or Tereso Newcomer, PA-C

## 2021-05-11 NOTE — Progress Notes (Signed)
Cardiology Office Note:    Date:  05/12/2021   ID:  [name], DOB Oct 10, 1980, MRN [id number]  PCP:  Kerin Salen, PA-C   [facility] Providers Cardiologist:  Tonny Bollman, MD Cardiology APP:  Kennon Rounds      Referring MD: Drosinis, Leonia Reader, PA-C   Chief Complaint:  F/u for CAD    Patient Profile:    Danny Reid is a 40 y.o. male with:  Coronary artery disease  S/p NSTEMI in 7/19 >> PCI: DES to RCA and DES to RPDA; staged PCI w/ DES to LCx x 2 Ischemic CM EF 45-50 Diabetes mellitus 2  Hyperlipidemia    Prior CV Studies: Cardiac Catheterization 2018/04/06 LM mild diffuse dz LAD mild irregs RI mid 50 LCx ost 60, prox 90 RCA dist stent patent; RPDA ost stent patent PCI:  2.5 x 12 Resolute Onyx DES to ost LCx; 2.5 x 15 mm Resolute Onyx DES to prox LCx        Echo April 06, 2018 Mild LVH, EF 45-50, inf-lat HK, trivial AI, mild MR   Cardiac Catheterization 03/06/18 LM mild diff dz LAD mild irregs RI 50 LCx ost 40, prox 90 RCA dist 100, RPDA ost 75 EF 35-45; inf AK PCI:  4 x 26 mm Resolute DES to distal RCA; 3 x 18 mm Resolute DES to ost RPDA     History of Present Illness: [name] was last seen in 07/2020 via Telemedicine.  He returns for f/u.  He is here alone.  Overall, he has been doing well.  When last seen, we discussed ordering home sleep study.  Unfortunately, this was never arranged.  He also never got a reminder to schedule a follow-up visit.  He has been doing well without chest pain, shortness of breath, syncope, orthopnea or leg edema.  He does note a history of snoring.  He has awoken himself from sleep with snoring.        Past Medical History:  Diagnosis Date   Acute MI (HCC) 03/05/2018   PCI to RCA and LCX   Chickenpox    Diabetes mellitus without complication (HCC)    borderline   High cholesterol    Hypertension     Current Medications: Current Meds  Medication Sig   aspirin EC 81 MG EC tablet Take 1 tablet (81  mg total) by mouth daily.   atorvastatin (LIPITOR) 80 MG tablet Take 1 tablet (80 mg total) by mouth daily at 6 PM.   clopidogrel (PLAVIX) 75 MG tablet Take 75 mg by mouth daily.   lisinopril (ZESTRIL) 2.5 MG tablet Take 1 tablet by mouth once daily   lisinopril (ZESTRIL) 5 MG tablet Take 1 tablet (5 mg total) by mouth daily.   metFORMIN (GLUCOPHAGE-XR) 500 MG 24 hr tablet Take 500 mg by mouth at bedtime.   metoprolol succinate (TOPROL XL) 25 MG 24 hr tablet Take 1 tablet (25 mg total) by mouth daily.   Multiple Vitamins-Minerals (ADULT ONE DAILY GUMMIES) CHEW Chew 2 tablets by mouth daily. Centrum Gummies   nitroGLYCERIN (NITROSTAT) 0.4 MG SL tablet Place 1 tablet (0.4 mg total) under the tongue every 5 (five) minutes as needed.   omega-3 acid ethyl esters (LOVAZA) 1 g capsule Take by mouth daily.   Omega-3 Fatty Acids (FISH OIL) 875 MG CAPS Take 1 capsule by mouth daily.   tamsulosin (FLOMAX) 0.4 MG CAPS capsule Take 0.4 mg by mouth daily.   triamcinolone cream (KENALOG) 0.1 % Apply 1 application  topically as needed (rash).      Allergies:   Patient has no known allergies.   Social History   Tobacco Use   Smoking status: Former    Packs/day: 0.50    Years: 13.00    Pack years: 6.50    Types: Cigars, Cigarettes    Quit date: 03/07/2018    Years since quitting: 3.1   Smokeless tobacco: Never  Vaping Use   Vaping Use: Never used  Substance Use Topics   Alcohol use: Yes    Comment: social   Drug use: No     Family Hx: The patient's family history includes Heart attack in his paternal grandfather; Hypertension in his mother; Multiple sclerosis in his paternal aunt.  Review of Systems  Respiratory:  Positive for snoring.   Gastrointestinal:  Negative for hematochezia.  Genitourinary:  Negative for hematuria.  Neurological:  Positive for excessive daytime sleepiness.    EKGs/Labs/Other Test Reviewed:    EKG:  EKG is ordered today.  The ekg ordered today demonstrates NSR, HR  77, inferior Q waves, possible lateral Q waves, QTC 424, nonspecific ST-T wave changes, similar to prior tracing  Recent Labs: No results found for requested labs within last 8760 hours.   Recent Lipid Panel Lab Results  Component Value Date/Time   CHOL 92 (L) 04/22/2018 08:33 AM   TRIG 83 04/22/2018 08:33 AM   HDL 37 (L) 04/22/2018 08:33 AM   LDLCALC 38 04/22/2018 08:33 AM   LDLDIRECT 114.1 03/13/2013 08:38 AM     Risk Assessment/Calculations:     STOP-Bang Score:  6       Physical Exam:    VS:  BP 138/70   Pulse 77   Ht 5\' 11"  (1.803 m)   Wt 259 lb 9.6 oz (117.8 kg)   SpO2 98%   BMI 36.21 kg/m     Wt Readings from Last 3 Encounters:  05/12/21 259 lb 9.6 oz (117.8 kg)  07/19/20 256 lb (116.1 kg)  04/21/19 256 lb (116.1 kg)     Constitutional:      Appearance: Healthy appearance. Not in distress.  Neck:     Vascular: JVD normal.  Pulmonary:     Effort: Pulmonary effort is normal.     Breath sounds: No wheezing. No rales.  Cardiovascular:     Normal rate. Regular rhythm. Normal S1. Normal S2.      Murmurs: There is no murmur.  Edema:    Peripheral edema absent.  Abdominal:     Palpations: Abdomen is soft. There is no hepatomegaly.  Skin:    General: Skin is warm and dry.  Neurological:     General: No focal deficit present.     Mental Status: Alert and oriented to person, place and time.     Cranial Nerves: Cranial nerves are intact.       ASSESSMENT & PLAN:    1. Coronary artery disease involving native coronary artery of native heart without angina pectoris History of non-STEMI in 2019 treated with DES to the RCA and RPDA and staged PCI with DES x2 to the LCx.  He is doing well without anginal symptoms.  His PCP changed his ticagrelor over to clopidogrel.  Continue clopidogrel 75 mg daily, aspirin 81 mg daily atorvastatin 80 mg daily, metoprolol succinate 25 mg daily.  Follow-up in 6 months.  2. Ischemic cardiomyopathy EF 45-50 the time of myocardial  infarction.  NYHA I.  Volume status stable.  He does not require diuretic therapy.  Continue lisinopril.  I will increase his dose to 5 mg daily for blood pressure.  Continue metoprolol succinate 25 mg daily.  3. Essential hypertension Blood pressure borderline elevated.  Increase lisinopril to 5 mg daily.  Continue metoprolol succinate 25 mg daily.  Obtain follow-up BMET in 1 month.  4. Pure hypercholesterolemia Labs from care everywhere reviewed.  Lipid panel from on 11/07/2020: Total cholesterol 118, triglycerides 208, LDL 41, HDL 36.  5. Elevated liver enzymes He has a history of elevated LFTs.  Care everywhere reviewed as noted above.  On 11/07/2020, his AST was 71 and ALT 148.  Abdominal ultrasound from 11/15/2020 in Care Everywhere was reviewed.  This did demonstrate changes that were most likely related to hepatic steatosis.  I suspect this is the reason for his elevated LFTs.  As his ALT was near 3 times the upper limit of normal at last check, I will repeat his LFTs today.  If his LFTs remain significantly elevated, I will stop his atorvastatin.  I will try to rechallenge him after several weeks with lower dose rosuvastatin.  Consider referral to GI.  6. Snoring He has significant symptoms for sleep apnea.  His stop bang score is elevated.  Home sleep study will be arranged.   Dispo:  Return in about 6 months (around 11/09/2021) for Routine Follow Up, w/ Tereso Newcomer, PA-C.   Medication Adjustments/Labs and Tests Ordered: Current medicines are reviewed at length with the patient today.  Concerns regarding medicines are outlined above.  Tests Ordered: Orders Placed This Encounter  Procedures   Hepatic function panel   Basic Metabolic Panel (BMET)   EKG 12-Lead   [facility] Sleep Study    Medication Changes: Meds ordered this encounter  Medications   lisinopril (ZESTRIL) 5 MG tablet    Sig: Take 1 tablet (5 mg total) by mouth daily.    Dispense:  90 tablet    Refill:  3      Signed, Tereso Newcomer, PA-C  05/12/2021 12:26 PM    [facility] [address] [place], [place], [place]  [address] Phone: [phone]; Fax: [phone]

## 2021-05-12 ENCOUNTER — Ambulatory Visit: Payer: 59 | Admitting: Physician Assistant

## 2021-05-12 ENCOUNTER — Other Ambulatory Visit: Payer: Self-pay

## 2021-05-12 ENCOUNTER — Encounter: Payer: Self-pay | Admitting: Physician Assistant

## 2021-05-12 VITALS — BP 138/70 | HR 77 | Ht 71.0 in | Wt 259.6 lb

## 2021-05-12 DIAGNOSIS — I255 Ischemic cardiomyopathy: Secondary | ICD-10-CM

## 2021-05-12 DIAGNOSIS — G4730 Sleep apnea, unspecified: Secondary | ICD-10-CM

## 2021-05-12 DIAGNOSIS — I2510 Atherosclerotic heart disease of native coronary artery without angina pectoris: Secondary | ICD-10-CM

## 2021-05-12 DIAGNOSIS — I10 Essential (primary) hypertension: Secondary | ICD-10-CM | POA: Diagnosis not present

## 2021-05-12 DIAGNOSIS — E7800 Pure hypercholesterolemia, unspecified: Secondary | ICD-10-CM

## 2021-05-12 DIAGNOSIS — R748 Abnormal levels of other serum enzymes: Secondary | ICD-10-CM

## 2021-05-12 LAB — HEPATIC FUNCTION PANEL
ALT: 34 IU/L (ref 0–44)
AST: 20 IU/L (ref 0–40)
Albumin: 4.9 g/dL (ref 4.0–5.0)
Alkaline Phosphatase: 67 IU/L (ref 44–121)
Bilirubin Total: 0.9 mg/dL (ref 0.0–1.2)
Bilirubin, Direct: 0.24 mg/dL (ref 0.00–0.40)
Total Protein: 7.3 g/dL (ref 6.0–8.5)

## 2021-05-12 MED ORDER — LISINOPRIL 5 MG PO TABS: 5.0000 mg | ORAL_TABLET | Freq: Every day | ORAL | 3 refills | Status: DC

## 2021-05-12 NOTE — Patient Instructions (Signed)
Medication Instructions:   START Lisinopril one tablet by mouth ( 5 mg) daily.  *If you need a refill on your cardiac medications before your next appointment, please call your pharmacy*   Lab Work: TODAY!!!!!! LFT  Your physician recommends that you return for lab work Wednesday, October 12 between 7:30 - 4:30.   If you have labs (blood work) drawn today and your tests are completely normal, you will receive your results only by: MyChart Message (if you have MyChart) OR A paper copy in the mail If you have any lab test that is abnormal or we need to change your treatment, we will call you to review the results.   Testing/Procedures: Your physician has recommended that you have a sleep study. This test records several body functions during sleep, including: brain activity, eye movement, oxygen and carbon dioxide blood levels, heart rate and rhythm, breathing rate and rhythm, the flow of air through your mouth and nose, snoring, body muscle movements, and chest and belly movement.   Follow-Up: At [facility], you and your health needs are our priority.  As part of our continuing mission to provide you with exceptional heart care, we have created designated Provider Care Teams.  These Care Teams include your primary Cardiologist (physician) and Advanced Practice Providers (APPs -  Physician Assistants and Nurse Practitioners) who all work together to provide you with the care you need, when you need it.  We recommend signing up for the patient portal called "MyChart".  Sign up information is provided on this After Visit Summary.  MyChart is used to connect with patients for Virtual Visits (Telemedicine).  Patients are able to view lab/test results, encounter notes, upcoming appointments, etc.  Non-urgent messages can be sent to your provider as well.   To learn more about what you can do with MyChart, go to [email or web address].    Your next appointment:   6 month(s)  The format  for your next appointment:   In Person  Provider:   Tereso Newcomer, PA-C

## 2021-05-12 NOTE — Progress Notes (Signed)
Sleep Apnea Evaluation  [facility]  Today's Date: 05/12/2021   Patient Name: Danny Reid        DOB: 03/05/1981       Height:  5\' 11"  (1.803 m)     Weight: 259 lb 9.6 oz (117.8 kg)  BMI: Body mass index is 36.21 kg/m.    Referring Provider:  , PA-C    STOP-BANG RISK ASSESSMENT    STOP-BANG 05/12/2021  Do you snore loudly? Yes  Do you often feel tired, fatigued, or sleepy during the daytime? Yes  Has anyone observed you stop breathing during sleep? No  Do you have (or are you being treated for) high blood pressure? Yes  Recent BMI (Calculated) 36.22  Is BMI greater than 35 kg/m2? 1=Yes  Age older than 42 years old? 0=No  Has large neck size > 40 cm (15.7 in, large male shirt size, large male collar size > 16) Yes  Gender - Male 1=Yes  STOP-Bang Total Score 6      If STOP-BANG Score ?3 OR two clinical symptoms - patient qualifies for WatchPAT (CPT 95800)      Sleep study ordered due to two (2) of the following clinical symptoms/diagnoses:  Excessive daytime sleepiness G47.10  Gastroesophageal reflux K21.9  Nocturia R35.1  Morning Headaches G44.221  Difficulty concentrating R41.840  Memory problems or poor judgment G31.84  Personality changes or irritability R45.4  Loud snoring R06.83  Depression F32.9  Unrefreshed by sleep G47.8  Impotence N52.9  History of high blood pressure R03.0  Insomnia G47.00  Sleep Disordered Breathing or Sleep Apnea ICD G47.33

## 2021-05-14 ENCOUNTER — Telehealth: Payer: Self-pay

## 2021-05-14 NOTE — Telephone Encounter (Signed)
-----   Message from Gaynelle Cage, CMA sent at 05/13/2021 11:51 AM EDT ----- Regarding: RE: [facility] Sleep Study Ok to activate. No PA required. ----- Message ----- From: Lattie Haw, RN Sent: 05/12/2021  12:38 PM EDT To: [facility] Subject: Donnie Coffin Sleep Study                             Patient needs [facility] Sleep Study per Tereso Newcomer, PA. Please pre-cert. Thanks

## 2021-05-14 NOTE — Telephone Encounter (Signed)
Called and made the patient aware that he may proceed with the [facility] Home Sleep Study. PIN # provided to the patient. Patient made aware that he will be contacted after the test has been read with the results and any recommendations. Patient verbalized understanding and thanked me for the call.   

## 2021-05-16 ENCOUNTER — Encounter (INDEPENDENT_AMBULATORY_CARE_PROVIDER_SITE_OTHER): Payer: 59 | Admitting: Cardiology

## 2021-05-16 DIAGNOSIS — G4733 Obstructive sleep apnea (adult) (pediatric): Secondary | ICD-10-CM

## 2021-05-25 ENCOUNTER — Ambulatory Visit: Payer: 59

## 2021-05-25 DIAGNOSIS — I2510 Atherosclerotic heart disease of native coronary artery without angina pectoris: Secondary | ICD-10-CM

## 2021-05-25 DIAGNOSIS — I255 Ischemic cardiomyopathy: Secondary | ICD-10-CM

## 2021-05-25 DIAGNOSIS — E7800 Pure hypercholesterolemia, unspecified: Secondary | ICD-10-CM

## 2021-05-25 DIAGNOSIS — R748 Abnormal levels of other serum enzymes: Secondary | ICD-10-CM

## 2021-05-25 DIAGNOSIS — I10 Essential (primary) hypertension: Secondary | ICD-10-CM

## 2021-05-25 NOTE — Procedures (Signed)
   Sleep Study Report  Patient Information Study Date: 05/16/21 Patient Name: Danny Reid Patient ID: [id number] Birth Date: 06-29-1981 Age: 40 Gender:Male BMI: 36.4 (W=260 lb, H=5' 11'') Referring Physician: Tereso Newcomer, PA  TEST DESCRIPTION: Home sleep apnea testing was completed using the WatchPat, a Type 1 device, utilizing peripheral arterial tonometry (PAT), chest movement, actigraphy, pulse oximetry, pulse rate, body position and snore. AHI was calculated with apnea and hypopnea using valid sleep time as the denominator. RDI includes apneas, hypopneas, and RERAs. The data acquired and the scoring of sleep and all associated events were performed in accordance with the recommended standards and specifications as outlined in the AASM Manual for the Scoring of Sleep and Associated Events 2.2.0 (2015).  FINDINGS: 1. Mild Obstructive Sleep Apnea with AHI 5.1/hr. 2. No Central Sleep Apnea with pAHIc 0/hr. 3. Oxygen desaturations as low as 87%. 4. Severe snoring was present. O2 sats were < 88% for 0 min. 5. Total sleep time was 5 hrs and 50 min. 6. 12.7 % of total sleep time was spent in REM sleep. 7. Normal sleep onset latency at 24 min. 8. Prolonged REM sleep onset latency at 116 min. 9. Total awakenings were 8.  DIAGNOSIS: Mild Obstructive Sleep Apnea (G47.33)  RECOMMENDATIONS: 1. Clinical correlation of these findings is necessary. The decision to treat obstructive sleep apnea (OSA) is usually based on the presence of apnea symptoms or the presence of associated medical conditions such as Hypertension, Congestive Heart Failure, Atrial Fibrillation or Obesity. The most common symptoms of OSA are snoring, gasping for breath while sleeping, daytime sleepiness and fatigue.  2. Initiating apnea therapy is recommended given the presence of symptoms and/or associated conditions. Recommend proceeding with one of the following:   a. Auto-CPAP therapy with a pressure range of  5-20cm H2O.   b. An oral appliance (OA) that can be obtained from certain dentists with expertise in sleep medicine. These are primarily of use in non-obese patients with mild and moderate disease.   c. An ENT consultation which may be useful to look for specific causes of obstruction and possible treatment options.   d. If patient is intolerant to PAP therapy, consider referral to ENT for evaluation for hypoglossal nerve stimulator.  3. Close follow-up is necessary to ensure success with CPAP or oral appliance therapy for maximum benefit .  4. A follow-up oximetry study on CPAP is recommended to assess the adequacy of therapy and determine the need for supplemental oxygen or the potential need for Bi-level therapy. An arterial blood gas to determine the adequacy of baseline ventilation and oxygenation should also be considered.  5. Healthy sleep recommendations include: adequate nightly sleep (normal 7-9 hrs/night), avoidance of caffeine after noon and alcohol near bedtime, and maintaining a sleep environment that is cool, dark and quiet.  6. Weight loss for overweight patients is recommended. Even modest amounts of weight loss can significantly improve the severity of sleep apnea.  7. Snoring recommendations include: weight loss where appropriate, side sleeping, and avoidance of alcohol before bed.  8. Operation of motor vehicle should be avoided when sleepy.  Signature: Electronically Signed: 05/25/21 Armanda Magic, MD; [facility]; Diplomat, American Board of Sleep Medicine Report prepared by: Armanda Magic

## 2021-05-26 ENCOUNTER — Telehealth: Payer: Self-pay | Admitting: *Deleted

## 2021-05-26 DIAGNOSIS — G4733 Obstructive sleep apnea (adult) (pediatric): Secondary | ICD-10-CM

## 2021-05-26 NOTE — Telephone Encounter (Signed)
-----   Message from Quintella Reichert, MD sent at 05/25/2021  7:11 PM EDT ----- Essentially normal sleep study with minimally elevated AHI - given his sx of daytime sleepiness please get an in lab PSG

## 2021-05-26 NOTE — Telephone Encounter (Signed)
The patient has been notified of the result and verbalized understanding.  All questions (if any) were answered. Latrelle Dodrill, CMA 05/26/2021 4:43 PM    PSG to precert

## 2021-05-29 NOTE — Progress Notes (Signed)
Sleep study shows OSA. Pt to be set up for CPAP. Tereso Newcomer, PA-C    05/29/2021 2:51 PM

## 2021-06-11 ENCOUNTER — Other Ambulatory Visit: Payer: 59

## 2021-06-23 NOTE — Telephone Encounter (Addendum)
NOT APPROVED 07/01/21---09/29/21 CASE #[id number]

## 2021-07-02 NOTE — Telephone Encounter (Addendum)
APPROVED AUTH# [id number] VALID DATES--11/1-10-01-21 REF# [id number].

## 2021-07-02 NOTE — Telephone Encounter (Signed)
Patient is scheduled for lab study on 08/13/21. Patient understands his sleep study will be done at [facility] sleep lab. Patient understands he will receive a sleep packet in a week or so. Patient understands to call if he does not receive the sleep packet in a timely manner. Patient agrees with treatment and thanked me for call.

## 2021-08-13 ENCOUNTER — Ambulatory Visit (HOSPITAL_BASED_OUTPATIENT_CLINIC_OR_DEPARTMENT_OTHER): Payer: 59 | Attending: Cardiology | Admitting: Cardiology

## 2021-08-13 ENCOUNTER — Other Ambulatory Visit: Payer: Self-pay

## 2021-08-13 DIAGNOSIS — G4733 Obstructive sleep apnea (adult) (pediatric): Secondary | ICD-10-CM | POA: Insufficient documentation

## 2021-08-13 DIAGNOSIS — G4736 Sleep related hypoventilation in conditions classified elsewhere: Secondary | ICD-10-CM | POA: Insufficient documentation

## 2021-08-19 NOTE — Procedures (Signed)
° °  Patient Name: Danny Reid, Longest Date:08/13/2021 Gender: Male D.O.B: 1981/07/30 Age (years): 73 Referring Provider: Armanda Magic MD, ABSM Height (inches): 71 Interpreting Physician: Armanda Magic MD, ABSM Weight (lbs): 266 RPSGT: Ulyess Mort BMI: 37 MRN: [id number] Neck Size: 19.00  CLINICAL INFORMATION Sleep Study Type: NPSG  Indication for sleep study: Daytime Fatigue, Diabetes, Fatigue, Hypertension, Obesity, Snoring  Epworth Sleepiness Score: 14  Most recent polysomnogram dated 05/16/2021 revealed an AHI of 5.1/h.  SLEEP STUDY TECHNIQUE As per the AASM Manual for the Scoring of Sleep and Associated Events v2.3 (April 2016) with a hypopnea requiring 4% desaturations.  The channels recorded and monitored were frontal, central and occipital EEG, electrooculogram (EOG), submentalis EMG (chin), nasal and oral airflow, thoracic and abdominal wall motion, anterior tibialis EMG, snore microphone, electrocardiogram, and pulse oximetry.  MEDICATIONS Medications self-administered by patient taken the night of the study : N/A  SLEEP ARCHITECTURE The study was initiated at 10:06:53 PM and ended at 4:29:26 AM.  Sleep onset time was 2.1 minutes and the sleep efficiency was 83.1%. The total sleep time was 318 minutes.  Stage REM latency was 145.5 minutes.  The patient spent 8.2% of the night in stage N1 sleep, 82.2% in stage N2 sleep, 0.0% in stage N3 and 9.6% in REM.  Alpha intrusion was absent.  Supine sleep was 36.95%.  RESPIRATORY PARAMETERS The overall apnea/hypopnea index (AHI) was 61.7 per hour. There were 145 total apneas, including 143 obstructive, 0 central and 2 mixed apneas. There were 182 hypopneas and 25 RERAs.  The AHI during Stage REM sleep was 74.8 per hour.  AHI while supine was 74.6 per hour.  The mean oxygen saturation was 91.3%. The minimum SpO2 during sleep was 73.0%.  loud snoring was noted during this study.  CARDIAC DATA The 2 lead EKG  demonstrated sinus rhythm. The mean heart rate was 64.2 beats per minute. Other EKG findings include: PVCs.  LEG MOVEMENT DATA The total PLMS were 0 with a resulting PLMS index of 0.0. Associated arousal with leg movement index was 0.0 .  IMPRESSIONS - Severe obstructive sleep apnea occurred during this study (AHI = 61.7/h). - Moderate oxygen desaturation was noted during this study (Min O2 = 73.0%). - The patient snored with loud snoring volume. - PVCs were noted during this study. - Clinically significant periodic limb movements did not occur during sleep. No significant associated arousals.  DIAGNOSIS - Obstructive Sleep Apnea (G47.33) - Nocturnal Hypoxemia (G47.36)  RECOMMENDATIONS - Therapeutic CPAP titration to determine optimal pressure required to alleviate sleep disordered breathing. - Positional therapy avoiding supine position during sleep. - Avoid alcohol, sedatives and other CNS depressants that may worsen sleep apnea and disrupt normal sleep architecture. - Sleep hygiene should be reviewed to assess factors that may improve sleep quality. - Weight management and regular exercise should be initiated or continued if appropriate.  [Electronically signed] 08/19/2021 06:18 PM  Armanda Magic MD, ABSM Diplomate, American Board of Sleep Medicine

## 2021-09-15 ENCOUNTER — Telehealth: Payer: Self-pay | Admitting: *Deleted

## 2021-09-15 DIAGNOSIS — G4733 Obstructive sleep apnea (adult) (pediatric): Secondary | ICD-10-CM

## 2021-09-15 NOTE — Telephone Encounter (Signed)
-----   Message from Sueanne Margarita, MD sent at 08/19/2021  6:20 PM EST ----- Please let patient know that they have sleep apnea.  Recommend therapeutic CPAP titration for treatment of patient's sleep disordered breathing.  If unable to perform an in lab titration then initiate ResMed auto CPAP from 4 to 15cm H2O with heated humidity and mask of choice and overnight pulse ox on CPAP.

## 2021-09-15 NOTE — Telephone Encounter (Signed)
The patient has been notified of the result and verbalized understanding.  All questions (if any) were answered. Latrelle Dodrill, CMA 09/15/2021 3:17 PM    To precert titration

## 2021-10-31 ENCOUNTER — Ambulatory Visit: Payer: 59 | Admitting: Physician Assistant

## 2021-11-21 NOTE — Telephone Encounter (Signed)
Status: ?PENDED ? ?Reason: ?1.Disposition pending review ?Tracking #: ?[id number] ?

## 2021-11-26 NOTE — Addendum Note (Signed)
Addended by: Freada Bergeron on: 11/26/2021 12:34 PM ? ? Modules accepted: Orders ? ?

## 2021-11-26 NOTE — Telephone Encounter (Signed)
NOT APPROVED  #[id number] ?WILL ORDER APAP ?ResMed auto CPAP from 4 to 15cm H2O with heated humidity and mask of choice and overnight pulse ox on CPAP.  ? ?Upon patient request DME selection is [facility] ?Patient understands he will be contacted by [facility] to set up his cpap. ?Patient understands to call if [facility] does not contact him with new setup in a timely manner. ?Patient understands they will be called once confirmation has been received from [facility] that they have received their new machine to schedule 10 week follow up appointment. ?  ?[facility] notified of new cpap order  ?Please add to airview ?Patient was grateful for the call and thanked me     ?

## 2021-12-29 ENCOUNTER — Encounter: Payer: Self-pay | Admitting: Cardiovascular Disease

## 2021-12-29 ENCOUNTER — Ambulatory Visit: Payer: 59 | Admitting: Cardiovascular Disease

## 2021-12-29 VITALS — BP 120/84 | HR 71 | Ht 70.0 in | Wt 266.2 lb

## 2021-12-29 DIAGNOSIS — E782 Mixed hyperlipidemia: Secondary | ICD-10-CM

## 2021-12-29 DIAGNOSIS — I2510 Atherosclerotic heart disease of native coronary artery without angina pectoris: Secondary | ICD-10-CM | POA: Diagnosis not present

## 2021-12-29 DIAGNOSIS — E669 Obesity, unspecified: Secondary | ICD-10-CM

## 2021-12-29 DIAGNOSIS — I10 Essential (primary) hypertension: Secondary | ICD-10-CM | POA: Diagnosis not present

## 2021-12-29 DIAGNOSIS — R748 Abnormal levels of other serum enzymes: Secondary | ICD-10-CM

## 2021-12-29 NOTE — Patient Instructions (Signed)
Medication Instructions:  Your physician recommends that you continue on your current medications as directed. Please refer to the Current Medication list given to you today.  *If you need a refill on your cardiac medications before your next appointment, please call your pharmacy*   Lab Work: NONE If you have labs (blood work) drawn today and your tests are completely normal, you will receive your results only by: MyChart Message (if you have MyChart) OR A paper copy in the mail If you have any lab test that is abnormal or we need to change your treatment, we will call you to review the results.   Testing/Procedures: NONE   Follow-Up: At [facility], you and your health needs are our priority.  As part of our continuing mission to provide you with exceptional heart care, we have created designated Provider Care Teams.  These Care Teams include your primary Cardiologist (physician) and Advanced Practice Providers (APPs -  Physician Assistants and Nurse Practitioners) who all work together to provide you with the care you need, when you need it.  Your next appointment:   6 month(s)  The format for your next appointment:   In Person  Provider:   Michelle Swinyer, NP or Scott Weaver, PA-C     Then, Michael Cooper, MD will plan to see you again in 1 year(s).   Important Information About Sugar       

## 2021-12-29 NOTE — Progress Notes (Signed)
Cardiology Office Note:    Date:  12/29/2021   ID:  Danny Reid, DOB [birth year], MRN [id number]  PCP:  Danny Salen, PA-C   [facility] Providers Cardiologist:  Tonny Bollman, MD Cardiology APP:  Kennon Rounds     Referring MD: Kathaleen Bury*   Chief Complaint  Patient presents with   Coronary Artery Disease    History of Present Illness:    Danny Reid is a 41 y.o. 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  He is doing relatively well from a cardiac perspective with no episodes of chest pain, chest pressure, or shortness of breath.  No heart palpitations, edema, orthopnea, or PND.  He asks about pharmacotherapy for weight loss today.  He was referred to weight loss specialist and his primary care provider in the [facility] system by the out-of-pocket cost was very expensive.  States that he is eating a lot of sweets and not consistently exercising.  Past Medical History:  Diagnosis Date   Acute MI (HCC) 03/05/2018   PCI to RCA and LCX   Chickenpox    Diabetes mellitus without complication (HCC)    borderline   High cholesterol    Hypertension     Past Surgical History:  Procedure Laterality Date   CORONARY STENT INTERVENTION N/A 03/06/2018   Procedure: CORONARY STENT INTERVENTION;  Surgeon: Tonny Bollman, MD;  Location: [facility] INVASIVE CV LAB;  Service: Cardiovascular;  Laterality: N/A;   CORONARY STENT INTERVENTION N/A 03/07/2018   Procedure: CORONARY STENT INTERVENTION;  Surgeon: Yvonne Kendall, MD;  Location: [facility] INVASIVE CV LAB;  Service: Cardiovascular;  Laterality: N/A;  CFX X 2 STENTS   CORONARY/GRAFT ACUTE MI REVASCULARIZATION N/A 03/06/2018   Procedure: Coronary/Graft Acute MI Revascularization;  Surgeon: Tonny Bollman, MD;  Location: [facility] INVASIVE CV LAB;   Service: Cardiovascular;  Laterality: N/A;   LEFT HEART CATH AND CORONARY ANGIOGRAPHY N/A 03/06/2018   Procedure: LEFT HEART CATH AND CORONARY ANGIOGRAPHY;  Surgeon: Tonny Bollman, MD;  Location: [facility] INVASIVE CV LAB;  Service: Cardiovascular;  Laterality: N/A;    Current Medications: Current Meds  Medication Sig   aspirin EC 81 MG EC tablet Take 1 tablet (81 mg total) by mouth daily.   atorvastatin (LIPITOR) 80 MG tablet Take 1 tablet (80 mg total) by mouth daily at 6 PM.   clopidogrel (PLAVIX) 75 MG tablet Take 75 mg by mouth daily.   lisinopril (ZESTRIL) 2.5 MG tablet Take 1 tablet by mouth once daily   lisinopril (ZESTRIL) 20 MG tablet Take 20 mg by mouth daily.   metFORMIN (GLUCOPHAGE-XR) 500 MG 24 hr tablet Take 500 mg by mouth at bedtime.   metoprolol succinate (TOPROL XL) 25 MG 24 hr tablet Take 1 tablet (25 mg total) by mouth daily.   Multiple Vitamin (MULTIVITAMIN) tablet Take 1 tablet by mouth daily. Per patient taking One a day vitamin   nitroGLYCERIN (NITROSTAT) 0.4 MG SL tablet Place 1 tablet (0.4 mg total) under the tongue every 5 (five) minutes as needed.   Omega-3 Fatty Acids (FISH OIL) 1000 MG CAPS Take by mouth daily.   triamcinolone cream (KENALOG) 0.1 % Apply 1 application topically as needed (rash).      Allergies:   Patient has no known allergies.  Social History   Socioeconomic History   Marital status: Single    Spouse name: Not on file   Number of children: Not on file   Years of education: Not on file   Highest education level: Not on file  Occupational History   Not on file  Tobacco Use   Smoking status: Former    Packs/day: 0.50    Years: 13.00    Pack years: 6.50    Types: Cigars, Cigarettes    Quit date: 03/07/2018    Years since quitting: 3.8   Smokeless tobacco: Never  Vaping Use   Vaping Use: Never used  Substance and Sexual Activity   Alcohol use: Yes    Comment: social   Drug use: No   Sexual activity: Not on file  Other Topics  Concern   Not on file  Social History Narrative   Not on file   Social Determinants of Health   Financial Resource Strain: Not on file  Food Insecurity: Not on file  Transportation Needs: Not on file  Physical Activity: Not on file  Stress: Not on file  Social Connections: Not on file     Family History: The patient's family history includes Heart attack in his paternal grandfather; Hypertension in his mother; Multiple sclerosis in his paternal aunt.  ROS:   Please see the history of present illness.    All other systems reviewed and are negative.  EKGs/Labs/Other Studies Reviewed:    The following studies were reviewed today: Cardiac Cath 2019: Conclusion    Dist RCA lesion is 100% stenosed. Ost RPDA to RPDA lesion is 75% stenosed. There is moderate left ventricular systolic dysfunction. LV end diastolic pressure is moderately elevated. The left ventricular ejection fraction is 35-45% by visual estimate. Ost Cx to Prox Cx lesion is 40% stenosed. Prox Cx to Mid Cx lesion is 90% stenosed. Ramus lesion is 50% stenosed. A drug-eluting stent was successfully placed using a STENT RESOLUTE ONYX 3.0X18. Post intervention, there is a 0% residual stenosis. Post intervention, there is a 0% residual stenosis. A drug-eluting stent was successfully placed using a STENT RESOLUTE ONYX 4.0X26.   1.  Acute total occlusion of the distal RCA with heavy thrombus, with severe stenosis extending into the distal RCA bifurcation of the PDA and PLA branches 2.  Severe stenosis of the mid left circumflex extending into the second obtuse marginal branch 3.  Mild nonobstructive disease of the left main, LAD, and ramus intermedius 4.  Moderate segmental LV dysfunction with akinesis of the entire inferior wall, LVEF estimated at approximately 40% 5.  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  Successful PCI of the distal RCA and  ostial PDA with overlapping drug-eluting stents   Recommendations: Continue Aggrastat x12 hours.  The patient is loaded with ticagrelor 180 mg in the cardiac Cath Lab.  Aggressive secondary risk reduction measures.   Recommend uninterrupted dual antiplatelet therapy with Aspirin 81mg  daily and Ticagrelor 90mg  twice daily for a minimum of 12 months (ACS - Class I recommendation).  EKG:  EKG is not ordered today.    Recent Labs: 05/12/2021: ALT 34  Recent Lipid Panel    Component Value Date/Time   CHOL 92 (L) 04/22/2018 0833   TRIG 83 04/22/2018 0833   HDL 37 (L) 04/22/2018 0833   CHOLHDL 2.5 04/22/2018 0833   CHOLHDL 4.2 03/07/2018 0233   VLDL 44 (H) 03/07/2018 0233   LDLCALC 38 04/22/2018 5409  LDLDIRECT 114.1 03/13/2013 0838     Risk Assessment/Calculations:      STOP-Bang Score:  6       Physical Exam:    VS:  BP 120/84   Pulse 71   Ht 5\' 10"  (1.778 m)   Wt 266 lb 3.2 oz (120.7 kg)   SpO2 97%   BMI 38.20 kg/m     Wt Readings from Last 3 Encounters:  12/29/21 266 lb 3.2 oz (120.7 kg)  08/13/21 266 lb (120.7 kg)  05/12/21 259 lb 9.6 oz (117.8 kg)     GEN:  Well nourished, well developed obese male in no acute distress HEENT: Normal NECK: No JVD; No carotid bruits LYMPHATICS: No lymphadenopathy CARDIAC: RRR, no murmurs, rubs, gallops RESPIRATORY:  Clear to auscultation without rales, wheezing or rhonchi  ABDOMEN: Soft, non-tender, non-distended MUSCULOSKELETAL:  No edema; No deformity  SKIN: Warm and dry NEUROLOGIC:  Alert and oriented x 3 PSYCHIATRIC:  Normal affect   ASSESSMENT:    1. Coronary artery disease involving native coronary artery of native heart without angina pectoris   2. Mixed hyperlipidemia   3. Essential hypertension   4. Elevated liver enzymes   5. Obesity (BMI 30-39.9)    PLAN:    In order of problems listed above:  Stable without angina.  Continues on dual antiplatelet therapy with aspirin and clopidogrel, beta-blockade with  metoprolol succinate, and high intensity statin drug with atorvastatin 80 g daily.  No medication changes are made today.  Probably could be treated with antiplatelet monotherapy in the future.  When he returns in 6 months, I would be inclined to discontinue clopidogrel. Treated with atorvastatin 80 mg daily.  Cholesterol panel was reviewed from December 2022 with a total cholesterol 135, HDL 44, LDL 66. Blood pressure controlled on lisinopril in the setting of his diabetes and coronary artery disease. Likely due to fatty liver.  Transaminases have normalized and last checked in December 2022 showed an AST of 17 and an ALT of 25. Discussed diet and lifestyle modification at length today.  Patient needs to reduce sweets and increase exercise.  All of this is reviewed with him.    Medication Adjustments/Labs and Tests Ordered: Current medicines are reviewed at length with the patient today.  Concerns regarding medicines are outlined above.  No orders of the defined types were placed in this encounter.  No orders of the defined types were placed in this encounter.   Patient Instructions  Medication Instructions:  Your physician recommends that you continue on your current medications as directed. Please refer to the Current Medication list given to you today.  *If you need a refill on your cardiac medications before your next appointment, please call your pharmacy*   Lab Work: NONE If you have labs (blood work) drawn today and your tests are completely normal, you will receive your results only by: MyChart Message (if you have MyChart) OR A paper copy in the mail If you have any lab test that is abnormal or we need to change your treatment, we will call you to review the results.   Testing/Procedures: NONE   Follow-Up: At [facility], you and your health needs are our priority.  As part of our continuing mission to provide you with exceptional heart care, we have created designated  Provider Care Teams.  These Care Teams include your primary Cardiologist (physician) and Advanced Practice Providers (APPs -  Physician Assistants and Nurse Practitioners) who all work together to provide you with the  care you need, when you need it.  Your next appointment:   6 month(s)  The format for your next appointment:   In Person  Provider:   Eligha Bridegroom, NP or Tereso Newcomer, PA-C     Then, Tonny Bollman, MD will plan to see you again in 1 year(s).   Important Information About Sugar         Signed, Tonny Bollman, MD  12/29/2021 3:38 PM    [facility]

## 2022-02-14 ENCOUNTER — Encounter: Payer: Self-pay | Admitting: *Deleted

## 2022-02-14 LAB — GLUCOSE, POCT (MANUAL RESULT ENTRY): POC Glucose: 115 mg/dl — AB (ref 70–99)

## 2022-08-16 NOTE — Progress Notes (Deleted)
Cardiology Office Note:    Date:  08/16/2022   ID:  Danny Reid, DOB December 27, 1980, MRN [id number]  PCP:  Kerin Salen, PA-C   [facility] Providers Cardiologist:  Tonny Bollman, MD Cardiology APP:  Beatrice Lecher, PA-C { Click to update primary MD,subspecialty MD or APP then REFRESH:1}    Referring MD: Kathaleen Bury*   Chief Complaint: ***  History of Present Illness:    Danny Reid is a *** 41 y.o. male with a hx of   Coronary artery disease  S/p NSTEMI in 7/19 >> PCI: DES to RCA and DES to RPDA; staged PCI w/ DES to LCx x 2 Ischemic CM EF 45-50 Diabetes mellitus 2  Hyperlipidemia  Elevated liver enzymes Obesity  Seen on 05/12/2021 by Tereso Newcomer, PA at which time he reported home sleep study which was previously discussed was never arranged.  He awakens himself from sleep with snoring.  STOP-BANG score of 6.  He was diagnosed with severe sleep apnea and started on CPAP 07/2021.   Seen in follow-up by Dr. Excell Seltzer on 12/29/21 with request to discuss pharmacotherapy for weight loss.  He was encouraged to work on better diet, reducing intake of sweets and to increase exercise.  Advised to follow-up in 6 months  Today, he is here  Discuss stopping Plavix  Past Medical History:  Diagnosis Date   Acute MI (HCC) 03/05/2018   PCI to RCA and LCX   Chickenpox    Diabetes mellitus without complication (HCC)    borderline   High cholesterol    Hypertension     Past Surgical History:  Procedure Laterality Date   CORONARY STENT INTERVENTION N/A 03/06/2018   Procedure: CORONARY STENT INTERVENTION;  Surgeon: Tonny Bollman, MD;  Location: [facility] INVASIVE CV LAB;  Service: Cardiovascular;  Laterality: N/A;   CORONARY STENT INTERVENTION N/A 03/07/2018   Procedure: CORONARY STENT INTERVENTION;  Surgeon: Yvonne Kendall, MD;  Location: [facility] INVASIVE CV LAB;  Service: Cardiovascular;  Laterality: N/A;  CFX X 2 STENTS   CORONARY/GRAFT ACUTE MI REVASCULARIZATION  N/A 03/06/2018   Procedure: Coronary/Graft Acute MI Revascularization;  Surgeon: Tonny Bollman, MD;  Location: [facility] INVASIVE CV LAB;  Service: Cardiovascular;  Laterality: N/A;   LEFT HEART CATH AND CORONARY ANGIOGRAPHY N/A 03/06/2018   Procedure: LEFT HEART CATH AND CORONARY ANGIOGRAPHY;  Surgeon: Tonny Bollman, MD;  Location: [facility] INVASIVE CV LAB;  Service: Cardiovascular;  Laterality: N/A;    Current Medications: No outpatient medications have been marked as taking for the 08/20/22 encounter (Appointment) with Lissa Hoard, Zachary George, NP.     Allergies:   Patient has no known allergies.   Social History   Socioeconomic History   Marital status: Single    Spouse name: Not on file   Number of children: Not on file   Years of education: Not on file   Highest education level: Not on file  Occupational History   Not on file  Tobacco Use   Smoking status: Former    Packs/day: 0.50    Years: 13.00    Total pack years: 6.50    Types: Cigars, Cigarettes    Quit date: 03/07/2018    Years since quitting: 4.4   Smokeless tobacco: Never  Vaping Use   Vaping Use: Never used  Substance and Sexual Activity   Alcohol use: Yes    Comment: social   Drug use: No   Sexual activity: Not on file  Other Topics Concern   Not on file  Social History Narrative   Not on file   Social Determinants of Health   Financial Resource Strain: Low Risk  (03/06/2018)   Overall Financial Resource Strain (CARDIA)    Difficulty of Paying Living Expenses: Not hard at all  Food Insecurity: No Food Insecurity (03/06/2018)   Hunger Vital Sign    Worried About Running Out of Food in the Last Year: Never true    Ran Out of Food in the Last Year: Never true  Transportation Needs: No Transportation Needs (03/06/2018)   PRAPARE - Hydrologist (Medical): No    Lack of Transportation (Non-Medical): No  Physical Activity: Insufficiently Active (03/06/2018)   Exercise Vital Sign    Days of Exercise  per Week: 3 days    Minutes of Exercise per Session: 20 min  Stress: No Stress Concern Present (03/06/2018)   [place]    Feeling of Stress : Only a little  Social Connections: Somewhat Isolated (03/06/2018)   Social Connection and Isolation Panel [NHANES]    Frequency of Communication with Friends and Family: More than three times a week    Frequency of Social Gatherings with Friends and Family: More than three times a week    Attends Religious Services: Never    Marine scientist or Organizations: Yes    Attends Music therapist: 1 to 4 times per year    Marital Status: Never married     Family History: The patient's ***family history includes Heart attack in his paternal grandfather; Hypertension in his mother; Multiple sclerosis in his paternal aunt.  ROS:   Please see the history of present illness.    *** All other systems reviewed and are negative.  Labs/Other Studies Reviewed:    The following studies were reviewed today:  Prior CV Studies: Cardiac Catheterization 04-06-18 LM mild diffuse dz LAD mild irregs RI mid 50 LCx ost 60, prox 90 RCA dist stent patent; RPDA ost stent patent PCI:  2.5 x 12 Resolute Onyx DES to ost LCx; 2.5 x 15 mm Resolute Onyx DES to prox LCx        Echo 04/06/2018 Mild LVH, EF 45-50, inf-lat HK, trivial AI, mild MR   Cardiac Catheterization 03/06/18 LM mild diff dz LAD mild irregs RI 50 LCx ost 40, prox 90 RCA dist 100, RPDA ost 75 EF 35-45; inf AK PCI:  4 x 26 mm Resolute DES to distal RCA; 3 x 18 mm Resolute DES to ost RPDA       Recent Labs: No results found for requested labs within last 365 days.  Recent Lipid Panel    Component Value Date/Time   CHOL 92 (L) 04/22/2018 0833   TRIG 83 04/22/2018 0833   HDL 37 (L) 04/22/2018 0833   CHOLHDL 2.5 04/22/2018 0833   CHOLHDL 4.2 2018-04-06 0233   VLDL 44 (H) 06-Apr-2018 0233   LDLCALC 38 04/22/2018 0833    LDLDIRECT 114.1 03/13/2013 0838     Risk Assessment/Calculations:   {Does this patient have ATRIAL FIBRILLATION?:[phone]}  STOP-Bang Score:     { Consider Dx Sleep Disordered Breathing or Sleep Apnea  ICD G47.33          :1}     Physical Exam:    VS:  There were no vitals taken for this visit.    Wt Readings from Last 3 Encounters:  12/29/21 266 lb 3.2 oz (120.7 kg)  08/13/21 266 lb (120.7  kg)  05/12/21 259 lb 9.6 oz (117.8 kg)     GEN: *** Well nourished, well developed in no acute distress HEENT: Normal NECK: No JVD; No carotid bruits CARDIAC: ***RRR, no murmurs, rubs, gallops RESPIRATORY:  Clear to auscultation without rales, wheezing or rhonchi  ABDOMEN: Soft, non-tender, non-distended MUSCULOSKELETAL:  No edema; No deformity. *** pedal pulses, ***bilaterally SKIN: Warm and dry NEUROLOGIC:  Alert and oriented x 3 PSYCHIATRIC:  Normal affect   EKG:  EKG is *** ordered today.  The ekg ordered today demonstrates ***  No BP recorded.  {Refresh Note OR Click here to enter BP  :1}***    Diagnoses:    No diagnosis found. Assessment and Plan:     CAD without angina:  Hyperlipidemia LDL goal < 70:  Hypertension:  Obesity:   {Are you ordering a CV Procedure (e.g. stress test, cath, DCCV, TEE, etc)?   Press F2        :[id number]   Disposition:  Medication Adjustments/Labs and Tests Ordered: Current medicines are reviewed at length with the patient today.  Concerns regarding medicines are outlined above.  No orders of the defined types were placed in this encounter.  No orders of the defined types were placed in this encounter.   There are no Patient Instructions on file for this visit.   Signed, Emmaline Life, NP  08/16/2022 4:55 PM    [place]

## 2022-08-20 ENCOUNTER — Ambulatory Visit: Payer: 59 | Admitting: Nurse Practitioner

## 2022-09-09 ENCOUNTER — Ambulatory Visit: Payer: 59 | Admitting: Nurse Practitioner

## 2022-10-01 ENCOUNTER — Ambulatory Visit: Payer: 59 | Admitting: Nurse Practitioner

## 2023-04-23 NOTE — Progress Notes (Unsigned)
Office Visit    Patient Name: Danny Reid Date of Encounter: 04/23/2023  Primary Care Provider:  Kerin Salen, PA-C Primary Cardiologist:  Tonny Bollman, MD Primary Electrophysiologist: None   Past Medical History    Past Medical History:  Diagnosis Date   Acute MI (HCC) 03/05/2018   PCI to RCA and LCX   Chickenpox    Diabetes mellitus without complication (HCC)    borderline   High cholesterol    Hypertension    Past Surgical History:  Procedure Laterality Date   CORONARY STENT INTERVENTION N/A 03/06/2018   Procedure: CORONARY STENT INTERVENTION;  Surgeon: Tonny Bollman, MD;  Location: [facility] INVASIVE CV LAB;  Service: Cardiovascular;  Laterality: N/A;   CORONARY STENT INTERVENTION N/A 03/07/2018   Procedure: CORONARY STENT INTERVENTION;  Surgeon: Yvonne Kendall, MD;  Location: [facility] INVASIVE CV LAB;  Service: Cardiovascular;  Laterality: N/A;  CFX X 2 STENTS   CORONARY/GRAFT ACUTE MI REVASCULARIZATION N/A 03/06/2018   Procedure: Coronary/Graft Acute MI Revascularization;  Surgeon: Tonny Bollman, MD;  Location: [facility] INVASIVE CV LAB;  Service: Cardiovascular;  Laterality: N/A;   LEFT HEART CATH AND CORONARY ANGIOGRAPHY N/A 03/06/2018   Procedure: LEFT HEART CATH AND CORONARY ANGIOGRAPHY;  Surgeon: Tonny Bollman, MD;  Location: [facility] INVASIVE CV LAB;  Service: Cardiovascular;  Laterality: N/A;    Allergies  No Known Allergies   History of Present Illness    Danny Reid  is a 42year old male with a PMH of CAD s/p acute inferior MI 02/2018 with PCI/DES to RCA and RPDA and staged PCI with DES to left circumflex x 2, ICM, DM type II, OSA, HLD who presents today for 1 year follow-up.  Danny Reid was seen initially in 02/2018 when he presented with severe substernal chest pain and was found to have acute inferior MI.  He underwent LHC and was found to have 75% stenosis RPDA, 90% mid circumflex, 100% stenosed total occlusion of RCA that were all treated with PCI/DES.   Left circumflex was treated with overlapping stent.  Patient had staged PCI for repair of left circumflex lesion.  2D echo was completed showing EF of 45-50% with hypokinesis in the inferior lateral myocardium.  He was discharged with Brilinta/ASA uninterrupted for 1 year.  He was last seen by Dr. Excell Seltzer on 12/29/2021 for follow-up.  During visit patient was doing well with no episodes of chest pain.  He was interested in weight loss medication and reported eating lots of sweets and not exercising.  No medication changes were made at that time.  Since last being seen in the office patient reports***.  Patient denies chest pain, palpitations, dyspnea, PND, orthopnea, nausea, vomiting, dizziness, syncope, edema, weight gain, or early satiety.     ***Notes: -Patient can have clopidogrel discontinued and monotherapy would be okay Home Medications    Current Outpatient Medications  Medication Sig Dispense Refill   aspirin EC 81 MG EC tablet Take 1 tablet (81 mg total) by mouth daily.     atorvastatin (LIPITOR) 80 MG tablet Take 1 tablet (80 mg total) by mouth daily at 6 PM. 90 tablet 2   clopidogrel (PLAVIX) 75 MG tablet Take 75 mg by mouth daily.     lisinopril (ZESTRIL) 2.5 MG tablet Take 1 tablet by mouth once daily 90 tablet 2   lisinopril (ZESTRIL) 20 MG tablet Take 20 mg by mouth daily.     lisinopril (ZESTRIL) 5 MG tablet Take 1 tablet (5 mg total) by mouth daily. 90  tablet 3   metFORMIN (GLUCOPHAGE-XR) 500 MG 24 hr tablet Take 500 mg by mouth at bedtime.  1   metoprolol succinate (TOPROL XL) 25 MG 24 hr tablet Take 1 tablet (25 mg total) by mouth daily. 30 tablet 11   Multiple Vitamin (MULTIVITAMIN) tablet Take 1 tablet by mouth daily. Per patient taking One a day vitamin     Multiple Vitamins-Minerals (ADULT ONE DAILY GUMMIES) CHEW Chew 2 tablets by mouth daily. Centrum Gummies (Patient not taking: Reported on 12/29/2021)     nitroGLYCERIN (NITROSTAT) 0.4 MG SL tablet Place 1 tablet (0.4 mg  total) under the tongue every 5 (five) minutes as needed. 25 tablet 2   omega-3 acid ethyl esters (LOVAZA) 1 g capsule Take by mouth daily. (Patient not taking: Reported on 12/29/2021)     Omega-3 Fatty Acids (FISH OIL) 1000 MG CAPS Take by mouth daily.     Omega-3 Fatty Acids (FISH OIL) 875 MG CAPS Take 1 capsule by mouth daily. (Patient not taking: Reported on 12/29/2021)     tamsulosin (FLOMAX) 0.4 MG CAPS capsule Take 0.4 mg by mouth daily. (Patient not taking: Reported on 12/29/2021)     triamcinolone cream (KENALOG) 0.1 % Apply 1 application topically as needed (rash).   2   No current facility-administered medications for this visit.     Review of Systems  Please see the history of present illness.    (+)*** (+)***  All other systems reviewed and are otherwise negative except as noted above.  Physical Exam    Wt Readings from Last 3 Encounters:  12/29/21 266 lb 3.2 oz (120.7 kg)  08/13/21 266 lb (120.7 kg)  05/12/21 259 lb 9.6 oz (117.8 kg)   XB:MWUXL were no vitals filed for this visit.,There is no height or weight on file to calculate BMI.  Constitutional:      Appearance: Healthy appearance. Not in distress.  Neck:     Vascular: JVD normal.  Pulmonary:     Effort: Pulmonary effort is normal.     Breath sounds: No wheezing. No rales. Diminished in the bases Cardiovascular:     Normal rate. Regular rhythm. Normal S1. Normal S2.      Murmurs: There is no murmur.  Edema:    Peripheral edema absent.  Abdominal:     Palpations: Abdomen is soft non tender. There is no hepatomegaly.  Skin:    General: Skin is warm and dry.  Neurological:     General: No focal deficit present.     Mental Status: Alert and oriented to person, place and time.     Cranial Nerves: Cranial nerves are intact.  EKG/LABS/ Recent Cardiac Studies    ECG personally reviewed by me today - ***   Risk Assessment/Calculations:   {Does this patient have ATRIAL FIBRILLATION?:[phone]}  STOP-Bang  Score:     { Consider Dx Sleep Disordered Breathing or Sleep Apnea  ICD G47.33          :1}      Lab Results  Component Value Date   WBC 7.1 04/22/2018   HGB 14.1 04/22/2018   HCT 42.2 04/22/2018   MCV 85 04/22/2018   PLT 366 04/22/2018   Lab Results  Component Value Date   CREATININE 0.87 10/04/2018   BUN 10 10/04/2018   NA 137 10/04/2018   K 4.5 10/04/2018   CL 99 10/04/2018   CO2 24 10/04/2018   Lab Results  Component Value Date   ALT 34 05/12/2021  AST 20 05/12/2021   ALKPHOS 67 05/12/2021   BILITOT 0.9 05/12/2021   Lab Results  Component Value Date   CHOL 92 (L) 04/22/2018   HDL 37 (L) 04/22/2018   LDLCALC 38 04/22/2018   LDLDIRECT 114.1 03/13/2013   TRIG 83 04/22/2018   CHOLHDL 2.5 04/22/2018    Lab Results  Component Value Date   HGBA1C 6.0 (H) 03/06/2018     Assessment & Plan    1.  Coronary artery disease: -s/p acute inferior MI 02/2018 treated with PCI: DES to RCA and DES to RPDA; staged PCI w/ DES to LCx x 2  -Today patient reports***  2.  Hyperlipidemia: Patient's last LDL cholesterol was***  3.  DM type II: -Patient's last hemoglobin A1c was***  4.  Tobacco abuse: -Today patient reports***      Disposition: Follow-up with Tonny Bollman, MD or APP in *** months {Are you ordering a CV Procedure (e.g. stress test, cath, DCCV, TEE, etc)?   Press F2        :[id number]}   Medication Adjustments/Labs and Tests Ordered: Current medicines are reviewed at length with the patient today.  Concerns regarding medicines are outlined above.   Signed, Napoleon Form, Leodis Rains, NP 04/23/2023, 11:52 AM [facility]

## 2023-04-26 ENCOUNTER — Ambulatory Visit: Payer: 59 | Attending: Nurse Practitioner | Admitting: Nurse Practitioner

## 2023-04-26 ENCOUNTER — Encounter: Payer: Self-pay | Admitting: Nurse Practitioner

## 2023-04-26 VITALS — BP 122/76 | HR 77 | Ht 70.5 in | Wt 232.6 lb

## 2023-04-26 DIAGNOSIS — K30 Functional dyspepsia: Secondary | ICD-10-CM

## 2023-04-26 DIAGNOSIS — F17200 Nicotine dependence, unspecified, uncomplicated: Secondary | ICD-10-CM

## 2023-04-26 DIAGNOSIS — I2510 Atherosclerotic heart disease of native coronary artery without angina pectoris: Secondary | ICD-10-CM | POA: Diagnosis not present

## 2023-04-26 DIAGNOSIS — Z7985 Long-term (current) use of injectable non-insulin antidiabetic drugs: Secondary | ICD-10-CM

## 2023-04-26 DIAGNOSIS — G479 Sleep disorder, unspecified: Secondary | ICD-10-CM | POA: Diagnosis not present

## 2023-04-26 DIAGNOSIS — E119 Type 2 diabetes mellitus without complications: Secondary | ICD-10-CM | POA: Diagnosis not present

## 2023-04-26 DIAGNOSIS — E7800 Pure hypercholesterolemia, unspecified: Secondary | ICD-10-CM

## 2023-04-26 LAB — HEPATIC FUNCTION PANEL
ALT: 18 IU/L (ref 0–44)
AST: 14 IU/L (ref 0–40)
Albumin: 4.8 g/dL (ref 4.1–5.1)
Alkaline Phosphatase: 67 IU/L (ref 44–121)
Bilirubin Total: 0.6 mg/dL (ref 0.0–1.2)
Bilirubin, Direct: 0.2 mg/dL (ref 0.00–0.40)
Total Protein: 7.2 g/dL (ref 6.0–8.5)

## 2023-04-26 LAB — LIPID PANEL
Chol/HDL Ratio: 2.7 ratio (ref 0.0–5.0)
Cholesterol, Total: 112 mg/dL (ref 100–199)
HDL: 42 mg/dL (ref >39)
LDL Chol Calc (NIH): 53 mg/dL (ref 0–99)
Triglycerides: 84 mg/dL (ref 0–149)
VLDL Cholesterol Cal: 17 mg/dL (ref 5–40)

## 2023-04-26 MED ORDER — PANTOPRAZOLE SODIUM 20 MG PO TBEC: 20.0000 mg | DELAYED_RELEASE_TABLET | Freq: Every day | ORAL | 3 refills | Status: DC

## 2023-04-26 NOTE — Patient Instructions (Addendum)
Medication Instructions:  STOP Aspirin START Protonix 20mg  Take 1 tablet once a day  CONTINUE Taking the Plavix *If you need a refill on your cardiac medications before your next appointment, please call your pharmacy*   Lab Work: TODAY-LIPIDS & LFTs If you have labs (blood work) drawn today and your tests are completely normal, you will receive your results only by: MyChart Message (if you have MyChart) OR A paper copy in the mail If you have any lab test that is abnormal or we need to change your treatment, we will call you to review the results.   Testing/Procedures: None ordered   Follow-Up: At [facility], you and your health needs are our priority.  As part of our continuing mission to provide you with exceptional heart care, we have created designated Provider Care Teams.  These Care Teams include your primary Cardiologist (physician) and Advanced Practice Providers (APPs -  Physician Assistants and Nurse Practitioners) who all work together to provide you with the care you need, when you need it.  We recommend signing up for the patient portal called "MyChart".  Sign up information is provided on this After Visit Summary.  MyChart is used to connect with patients for Virtual Visits (Telemedicine).  Patients are able to view lab/test results, encounter notes, upcoming appointments, etc.  Non-urgent messages can be sent to your provider as well.   To learn more about what you can do with MyChart, go to [email or web address].    Your next appointment:   12 month(s)  Provider:   Robin Searing, NP   Other Instructions Please remind your daughters to do a calcium score and LP(a) testing

## 2023-04-27 ENCOUNTER — Telehealth: Payer: Self-pay

## 2023-04-27 DIAGNOSIS — R0683 Snoring: Secondary | ICD-10-CM

## 2023-04-27 DIAGNOSIS — G4734 Idiopathic sleep related nonobstructive alveolar hypoventilation: Secondary | ICD-10-CM

## 2023-04-27 DIAGNOSIS — G4733 Obstructive sleep apnea (adult) (pediatric): Secondary | ICD-10-CM

## 2023-04-27 NOTE — Telephone Encounter (Signed)
-----   Message from [facility]ica T sent at 04/26/2023 10:53 AM EDT ----- Regarding: status Hey,    This patient had a sleep study back in 05/2021 and never heard back. He states he was supposed to be fitted for a cpap. Can you please check the status and get back to him?   Thanks,  [place], [place]

## 2023-04-28 NOTE — Telephone Encounter (Signed)
Gaston Islam., NP  Bennie Hind hours ago (7:33 AM)    Please order a new HST for this gentleman.   Quintella Reichert, MD  Brunetta Genera, CMA; Gaston Islam., NP17 hours ago (4:12 PM)    Please make sure the Itamar HST gets ordered   Gaston Islam., NP  Brunetta Genera, CMA19 hours ago (3:09 PM)    Addendum completed per request. Please let me know if you need anything else.   Rorie, Irena Cords, CMA  Quintella Reichert, MD; Gaston Islam., NP19 hours ago (2:16 PM)    Per DME, he will need to have a new sleep study in order for insurance to cover the equipment. Robin Searing can addend his notes from yesterday and state patient never received CPAP Titration, and order an HST. DME said a HST will be good enough as far as testing and the last office note, which Alden Server stated patient is still having daytime somnolence.

## 2023-04-28 NOTE — Addendum Note (Signed)
Addended by: Alveta Heimlich on: 04/28/2023 11:36 AM   Modules accepted: Orders

## 2023-04-28 NOTE — Telephone Encounter (Addendum)
Spoke with the sleep coordinator and had her explain the sleep study process and steps.   Contacted the patient and explained that we have to start the process over for him to get a CPAP due to it being about two years since his last sleep study. I explained to the patient that I will need to do a stop bang questionnaire as a baseline for the Itamar. Then I explained to the patient that he will need to complete an Itamar home sleep study test first. I explained to the patient that once we get those results then we will proceed with the next steps which will be determined after the Itamar. I explained to the patient that someone would call him to set up a time for him to get the Itamar once it has been approved.   Message sent to sleep study pool.   Patient agreeable and voiced understanding.

## 2023-04-28 NOTE — Telephone Encounter (Addendum)
Spoke with Alden Server who states he wants the patient to have a Special educational needs teacher. I will contact the patient and go over the stop bang then make precert aware.

## 2023-07-16 ENCOUNTER — Telehealth: Payer: Self-pay

## 2023-07-16 NOTE — Telephone Encounter (Signed)
Contacted to advise him to wear the Itamar with in the next 30 days. Patient states he has not received the watch pat one device.   Please check records. And let me know if the patient was given a Itamar.

## 2023-07-16 NOTE — Telephone Encounter (Signed)
**Note De-Identified Danny Reid Obfuscation** Ordering provider: Robin Searing, NP  Associated diagnoses: Snoring-R06.83 and Somnolence-R40.0 WatchPAT PA obtained on 07/16/2023 by Laronda Lisby, Lorelle Formosa, LPN. Per the [facility] provider portal:  Prior Authorization/Notification is not required for the requested service(s). CPT Code: 36644 Decision ID #: [id number]  Patient will be notified of PIN ([id number]) on when he is provided a WatchPAT One-HST Device.  Phone note routed to covering staff for follow-up.  Instructions for covering staff:  Please contact patient in 2 weeks if WatchPAT study results are not available yet. Remind patient to complete test.  If patient declines to proceed with test, please confirm that box is unopened and remind patient to return it to the office within 30 days. Route phone note to CV DIV SLEEP STUDIES pool for tracking.  If box has been opened, please route phone note to Erskine Squibb (billing department).

## 2023-07-19 NOTE — Telephone Encounter (Signed)
I s/w the pt and he will come by the office tomorrow to set up Itamar sleep study. I received a message from the sleep coordinator pt needs device. In review of the chart, looks to be that I was not made aware the pt needed a sleep study. I apologized to the pt that I certainly would have called him before now if I had known.   Pt said that is ok and thanked me for the calling. Pt is approved and will be given the PIN# at set up.

## 2023-07-20 NOTE — Telephone Encounter (Signed)
Pt came by the office and set up Itamar sleep study.   Patient agreement reviewed and signed on 07/20/2023.  WatchPAT issued to patient on 07/20/2023 by Danielle Rankin, CMA. Patient aware to not open the WatchPAT box until contacted with the activation PIN. Patient profile initialized in CloudPAT on 07/20/2023 by Danielle Rankin, CMA. Device serial number: [id number]  Please list Reason for Call as Advice Only and type "WatchPAT issued to patient" in the comment box.  Pt is approved and has been given PIN# [id number] today. Pt states he will doe sleep study by this weekend.

## 2023-07-26 ENCOUNTER — Encounter (INDEPENDENT_AMBULATORY_CARE_PROVIDER_SITE_OTHER): Payer: 59 | Admitting: Cardiology

## 2023-07-26 DIAGNOSIS — G4733 Obstructive sleep apnea (adult) (pediatric): Secondary | ICD-10-CM | POA: Diagnosis not present

## 2023-07-27 ENCOUNTER — Ambulatory Visit: Payer: 59 | Attending: Nurse Practitioner

## 2023-07-27 DIAGNOSIS — G4733 Obstructive sleep apnea (adult) (pediatric): Secondary | ICD-10-CM

## 2023-07-27 DIAGNOSIS — G4734 Idiopathic sleep related nonobstructive alveolar hypoventilation: Secondary | ICD-10-CM

## 2023-07-27 DIAGNOSIS — R0683 Snoring: Secondary | ICD-10-CM

## 2023-07-27 NOTE — Procedures (Signed)
    SLEEP STUDY REPORT Patient Information Study Date: 07/26/2023 Patient Name: Danny Reid Patient ID: [id number] Birth Date: May 26, 1981 Age: 42 Gender:  BMI: 36.4 (W=260 lb, H=5' 11'') Referring Physician: Tereso Newcomer, PA  TEST DESCRIPTION: Home sleep apnea testing was completed using the WatchPat, a Type 1 device, utilizing  peripheral arterial tonometry (PAT), chest movement, actigraphy, pulse oximetry, pulse rate, body position and snore.  AHI was calculated with apnea and hypopnea using valid sleep time as the denominator. RDI includes apneas,  hypopneas, and RERAs. The data acquired and the scoring of sleep and all associated events were performed in  accordance with the recommended standards and specifications as outlined in the AASM Manual for the Scoring of  Sleep and Associated Events 2.2.0 (2015).   FINDINGS:   1. Mild Obstructive Sleep Apnea with AHI 7.7/hr.   2. No Central Sleep Apnea with pAHIc 0/hr.   3. Oxygen desaturations as low as 82%.   4. Severe snoring was present. O2 sats were < 88% for 4.3 min.   5. Total sleep time was 6 hrs and 44 min.   6. 22.7% of total sleep time was spent in REM sleep.   7. Normal sleep onset latency at 22 min.   8. Normal REM sleep onset latency at 89 min.   9. Total awakenings were 5.  10. Arrhythmia detection: None  DIAGNOSIS: Mild Obstructive Sleep Apnea (G47.33)  RECOMMENDATIONS: 1. Clinical correlation of these findings is necessary. The decision to treat obstructive sleep apnea (OSA) is usually  based on the presence of apnea symptoms or the presence of associated medical conditions such as Hypertension,  Congestive Heart Failure, Atrial Fibrillation or Obesity. The most common symptoms of OSA are snoring, gasping for  breath while sleeping, daytime sleepiness and fatigue.   2. Initiating apnea therapy is recommended given the presence of symptoms and/or associated conditions.  Recommend proceeding with one of the  following:   a. Auto-CPAP therapy with a pressure range of 5-20cm H2O.   b. An oral appliance (OA) that can be obtained from certain dentists with expertise in sleep medicine. These are  primarily of use in non-obese patients with mild and moderate disease.   c. An ENT consultation which may be useful to look for specific causes of obstruction and possible treatment  options.   d. If patient is intolerant to PAP therapy, consider referral to ENT for evaluation for hypoglossal nerve stimulator.   3. Close follow-up is necessary to ensure success with CPAP or oral appliance therapy for maximum benefit .  4. A follow-up oximetry study on CPAP is recommended to assess the adequacy of therapy and determine the need  for supplemental oxygen or the potential need for Bi-level therapy. An arterial blood gas to determine the adequacy of  baseline ventilation and oxygenation should also be considered.  5. Healthy sleep recommendations include: adequate nightly sleep (normal 7-9 hrs/night), avoidance of caffeine after  noon and alcohol near bedtime, and maintaining a sleep environment that is cool, dark and quiet.  6. Weight loss for overweight patients is recommended. Even modest amounts of weight loss can significantly  improve the severity of sleep apnea.  7. Snoring recommendations include: weight loss where appropriate, side sleeping, and avoidance of alcohol before  bed.  8. Operation of motor vehicle should be avoided when sleepy.  Signature: Armanda Magic, MD; [facility]; Diplomat, American Board of Sleep  Medicine Electronically Signed: 07/27/2023 6:17:00 PM

## 2023-07-28 ENCOUNTER — Telehealth: Payer: Self-pay

## 2023-07-28 NOTE — Telephone Encounter (Signed)
-----   Message from Armanda Magic sent at 07/27/2023  6:22 PM EST ----- Please let patient know that they have sleep apnea and recommend treating with CPAP.  Please order an auto CPAP from 4-15cm H2O with heated humidity and mask of choice.  Order overnight pulse ox on CPAP.  Followup with me in 6 weeks.

## 2023-07-28 NOTE — Telephone Encounter (Signed)
Notified patient of sleep study results and recommendations. All questions were answered and patient verbalized understanding. PAP order sent to [facility] today 07/28/23.

## 2023-11-15 NOTE — Progress Notes (Unsigned)
 Sleep Medicine CONSULT Note    Date:  11/16/2023   ID:  Danny Reid, DOB Apr 21, 1981, MRN [id number]  PCP:  Kerin Salen, PA-C  Cardiologist: Tonny Bollman, MD   Chief Complaint  Patient presents with   New Patient (Initial Visit)    OSA    History of Present Illness:  Danny Reid is a 43 y.o. male who is being seen today for the evaluation of OSA at the request of Tonny Bollman, MD.  This is a 43yo male with a hx of CAD s/p remote MI with PCI of LCx and RCA, DM, HTN and HLD.  Patient mentioned to Robin Searing, NP in Aug 2024 that he had daytime sleepiness with Stop Bang Score of 4 and a HST was done showing mild OSA with an AHI of 7.7/hr and was started on auto CPAP from 4 to 15cm H2O.  He is now referred for Sleep Medicine consult for evaluation of OSA and treatment.    He is doing well with his PAP device and thinks that he has gotten used to it.  He tolerates the nasal cushion mask and feels the pressure is adequate.  Since going on PAP he feels rested in the am and has no significant daytime sleepiness.  He denies any significant mouth or nasal dryness or nasal congestion.  He does not think that he snores.    Past Medical History:  Diagnosis Date   Acute MI (HCC) 03/05/2018   PCI to RCA and LCX   Chickenpox    Diabetes mellitus without complication (HCC)    borderline   High cholesterol    Hypertension    OSA on CPAP    mild OSA with an AHI of 7.7/hr on auto CPAP from 4 to 15cm H2O    Past Surgical History:  Procedure Laterality Date   CORONARY STENT INTERVENTION N/A 03/06/2018   Procedure: CORONARY STENT INTERVENTION;  Surgeon: Tonny Bollman, MD;  Location: [facility] INVASIVE CV LAB;  Service: Cardiovascular;  Laterality: N/A;   CORONARY STENT INTERVENTION N/A 03/07/2018   Procedure: CORONARY STENT INTERVENTION;  Surgeon: Yvonne Kendall, MD;  Location: [facility] INVASIVE CV LAB;  Service: Cardiovascular;  Laterality: N/A;  CFX X 2 STENTS   CORONARY/GRAFT ACUTE  MI REVASCULARIZATION N/A 03/06/2018   Procedure: Coronary/Graft Acute MI Revascularization;  Surgeon: Tonny Bollman, MD;  Location: [facility] INVASIVE CV LAB;  Service: Cardiovascular;  Laterality: N/A;   LEFT HEART CATH AND CORONARY ANGIOGRAPHY N/A 03/06/2018   Procedure: LEFT HEART CATH AND CORONARY ANGIOGRAPHY;  Surgeon: Tonny Bollman, MD;  Location: [facility] INVASIVE CV LAB;  Service: Cardiovascular;  Laterality: N/A;    Current Medications: Current Meds  Medication Sig   lisinopril (ZESTRIL) 20 MG tablet Take 20 mg by mouth daily.   MOUNJARO 10 MG/0.5ML Pen Inject 10 mg into the skin once a week.   Multiple Vitamins-Minerals (ADULT ONE DAILY GUMMIES) CHEW Chew 2 tablets by mouth daily. Centrum Gummies   nitroGLYCERIN (NITROSTAT) 0.4 MG SL tablet Place 1 tablet (0.4 mg total) under the tongue every 5 (five) minutes as needed.   Omega-3 Fatty Acids (FISH OIL) 875 MG CAPS Take 1 capsule by mouth daily.   pantoprazole (PROTONIX) 20 MG tablet Take 1 tablet (20 mg total) by mouth daily.   sertraline (ZOLOFT) 100 MG tablet Take 100 mg by mouth at bedtime.   triamcinolone cream (KENALOG) 0.1 % Apply 1 application topically as needed (rash).    [DISCONTINUED] atorvastatin (LIPITOR) 80 MG tablet Take  1 tablet (80 mg total) by mouth daily at 6 PM.   [DISCONTINUED] clopidogrel (PLAVIX) 75 MG tablet Take 75 mg by mouth daily.   [DISCONTINUED] metoprolol succinate (TOPROL XL) 25 MG 24 hr tablet Take 1 tablet (25 mg total) by mouth daily.   [DISCONTINUED] MOUNJARO 7.5 MG/0.5ML Pen SMARTSIG:0.5 Milliliter(s) SUB-Q Once a Week    Allergies:   Patient has no known allergies.   Social History   Socioeconomic History   Marital status: Single    Spouse name: Not on file   Number of children: Not on file   Years of education: Not on file   Highest education level: Not on file  Occupational History   Not on file  Tobacco Use   Smoking status: Former    Current packs/day: 0.00    Average packs/day: 0.5  packs/day for 13.0 years (6.5 ttl pk-yrs)    Types: Cigars, Cigarettes    Start date: 03/07/2005    Quit date: 03/07/2018    Years since quitting: 5.6   Smokeless tobacco: Never  Vaping Use   Vaping status: Never Used  Substance and Sexual Activity   Alcohol use: Yes    Comment: social   Drug use: No   Sexual activity: Not on file  Other Topics Concern   Not on file  Social History Narrative   Not on file   Social Drivers of Health   Financial Resource Strain: Low Risk  (03/06/2018)   Overall Financial Resource Strain (CARDIA)    Difficulty of Paying Living Expenses: Not hard at all  Food Insecurity: Unknown (09/21/2023)   Received from [facility]   Hunger Vital Sign    Worried About Running Out of Food in the Last Year: Patient declined to answer    Ran Out of Food in the Last Year: Patient declined to answer  Transportation Needs: No Transportation Needs (07/14/2022)   Received from [facility] visits prior to 10/31/2022., [facility] visits prior to 10/31/2022., [facility]   PRAPARE - Transportation    Lack of Transportation (Medical): No    Lack of Transportation (Non-Medical): No  Physical Activity: Insufficiently Active (03/06/2018)   Exercise Vital Sign    Days of Exercise per Week: 3 days    Minutes of Exercise per Session: 20 min  Stress: No Stress Concern Present (03/06/2018)   [employer] of Occupational Health - Occupational Stress Questionnaire    Feeling of Stress : Only a little  Social Connections: Somewhat Isolated (03/06/2018)   Social Connection and Isolation Panel [NHANES]    Frequency of Communication with Friends and Family: More than three times a week    Frequency of Social Gatherings with Friends and Family: More than three times a week    Attends Religious Services: Never    Database administrator or Organizations: Yes    Attends Engineer, structural: 1 to 4 times per year    Marital Status: Never  married     Family History:  The patient's family history includes Heart attack in his paternal grandfather; Hypertension in his mother; Multiple sclerosis in his paternal aunt.   ROS:   Please see the history of present illness.    ROS All other systems reviewed and are negative.      No data to display             PHYSICAL EXAM:   VS:  BP 130/72   Pulse 77  Ht 5' 10.5" (1.791 m)   Wt 234 lb 3.2 oz (106.2 kg)   SpO2 97%   BMI 33.13 kg/m    GEN: Well nourished, well developed, in no acute distress  HEENT: large tongue and small oropharynx  Neck: no JVD, carotid bruits, or masses Cardiac: RRR; no murmurs, rubs, or gallops,no edema.  Intact distal pulses bilaterally.  Respiratory:  clear to auscultation bilaterally, normal work of breathing GI: soft, nontender, nondistended, + BS MS: no deformity or atrophy  Skin: warm and dry, no rash Neuro:  Alert and Oriented x 3, Strength and sensation are intact Psych: euthymic mood, full affect  Wt Readings from Last 3 Encounters:  11/16/23 234 lb 3.2 oz (106.2 kg)  04/26/23 232 lb 9.6 oz (105.5 kg)  12/29/21 266 lb 3.2 oz (120.7 kg)      Studies/Labs Reviewed:   HST, PAP compliance download  Recent Labs: 04/26/2023: ALT 18     ASSESSMENT:    1. OSA (obstructive sleep apnea)   2. Essential hypertension      PLAN:  In order of problems listed above:  OSA - The patient is tolerating PAP therapy well without any problems. The PAP download performed by his DME was personally reviewed and interpreted by me today and showed an AHI of 2.1/hr on auto CPAP from 4 to 15 cm H2O with 93% compliance in using more than 4 hours nightly.  The patient has been using and benefiting from PAP use and will continue to benefit from therapy.  -he would like to try a nasal pillow mask to see if it is more comfortable  HTN -BP controlled on exam today -continue prescription drug management with Toprol XL 25mg  daily with PRN  refills   Time Spent: 20 minutes total time of encounter, including 14 minutes spent in face-to-face patient care on the date of this encounter. This time includes coordination of care and counseling regarding above mentioned problem list. Remainder of non-face-to-face time involved reviewing chart documents/testing relevant to the patient encounter and documentation in the medical record. I have independently reviewed documentation from referring provider  Medication Adjustments/Labs and Tests Ordered: Current medicines are reviewed at length with the patient today.  Concerns regarding medicines are outlined above.  Medication changes, Labs and Tests ordered today are listed in the Patient Instructions below.  There are no Patient Instructions on file for this visit.   Signed, Armanda Magic, MD  11/16/2023 9:21 AM    [facility] [address] [place], [place], [place]  [address] Phone: [phone]; Fax: [phone]

## 2023-11-16 ENCOUNTER — Ambulatory Visit: Attending: Cardiology | Admitting: Cardiology

## 2023-11-16 ENCOUNTER — Other Ambulatory Visit: Payer: Self-pay

## 2023-11-16 ENCOUNTER — Encounter: Payer: Self-pay | Admitting: Cardiology

## 2023-11-16 VITALS — BP 130/72 | HR 77 | Ht 70.5 in | Wt 234.2 lb

## 2023-11-16 DIAGNOSIS — E119 Type 2 diabetes mellitus without complications: Secondary | ICD-10-CM

## 2023-11-16 DIAGNOSIS — E782 Mixed hyperlipidemia: Secondary | ICD-10-CM

## 2023-11-16 DIAGNOSIS — G479 Sleep disorder, unspecified: Secondary | ICD-10-CM

## 2023-11-16 DIAGNOSIS — I10 Essential (primary) hypertension: Secondary | ICD-10-CM

## 2023-11-16 DIAGNOSIS — I2510 Atherosclerotic heart disease of native coronary artery without angina pectoris: Secondary | ICD-10-CM

## 2023-11-16 DIAGNOSIS — G4734 Idiopathic sleep related nonobstructive alveolar hypoventilation: Secondary | ICD-10-CM

## 2023-11-16 DIAGNOSIS — E7800 Pure hypercholesterolemia, unspecified: Secondary | ICD-10-CM

## 2023-11-16 DIAGNOSIS — R0683 Snoring: Secondary | ICD-10-CM

## 2023-11-16 DIAGNOSIS — G4733 Obstructive sleep apnea (adult) (pediatric): Secondary | ICD-10-CM | POA: Diagnosis not present

## 2023-11-16 MED ORDER — CLOPIDOGREL BISULFATE 75 MG PO TABS: 75.0000 mg | ORAL_TABLET | Freq: Every day | ORAL | 3 refills | Status: AC

## 2023-11-16 MED ORDER — METOPROLOL SUCCINATE ER 25 MG PO TB24
25.0000 mg | ORAL_TABLET | Freq: Every day | ORAL | 90 refills | Status: AC
Start: 2023-11-16 — End: 2024-11-15

## 2023-11-16 MED ORDER — ATORVASTATIN CALCIUM 80 MG PO TABS: 80.0000 mg | ORAL_TABLET | Freq: Every day | ORAL | 3 refills | Status: AC

## 2023-11-16 NOTE — Patient Instructions (Signed)
 Medication Instructions:  Your physician recommends that you continue on your current medications as directed. Please refer to the Current Medication list given to you today.  *If you need a refill on your cardiac medications before your next appointment, please call your pharmacy*   Lab Work: NONE  If you have labs (blood work) drawn today and your tests are completely normal, you will receive your results only by: MyChart Message (if you have MyChart) OR A paper copy in the mail If you have any lab test that is abnormal or we need to change your treatment, we will call you to review the results.   Testing/Procedures: NONE   Follow-Up: At [facility], you and your health needs are our priority.  As part of our continuing mission to provide you with exceptional heart care, we have created designated Provider Care Teams.  These Care Teams include your primary Cardiologist (physician) and Advanced Practice Providers (APPs -  Physician Assistants and Nurse Practitioners) who all work together to provide you with the care you need, when you need it.    Your next appointment:   1 year(s)  Provider:   Armanda Magic, MD

## 2023-11-16 NOTE — Addendum Note (Signed)
 Addended by: Quintella Reichert on: 11/16/2023 09:21 AM   Modules accepted: Level of Service

## 2023-11-16 NOTE — Progress Notes (Signed)
 Order sent to [facility] for a nasal pillow for his current mask that he uses a nasal cushion for right now.

## 2023-11-24 ENCOUNTER — Telehealth: Payer: Self-pay | Admitting: *Deleted

## 2023-11-24 NOTE — Telephone Encounter (Signed)
 From [facility]: Thank you we have made several attempts to get him setup and he was a no show to his first appointment, then asked [place] to hold the order until after 1/1 when his new insurance would be active, and is now not responding to our calls, we did send him a letter 1/20 to call us so hopefully he will.   I Have sent a mychart message to the patient.

## 2024-04-23 ENCOUNTER — Other Ambulatory Visit: Payer: Self-pay | Admitting: Nurse Practitioner

## 2024-05-23 ENCOUNTER — Other Ambulatory Visit: Payer: Self-pay | Admitting: Cardiovascular Disease

## 2024-06-11 ENCOUNTER — Other Ambulatory Visit: Payer: Self-pay | Admitting: Cardiovascular Disease

## 2024-07-13 ENCOUNTER — Other Ambulatory Visit: Payer: Self-pay | Admitting: Cardiovascular Disease

## 2024-07-30 ENCOUNTER — Other Ambulatory Visit: Payer: Self-pay | Admitting: Cardiovascular Disease
